# Patient Record
Sex: Female | Born: 1980 | Race: Black or African American | Hispanic: No | Marital: Single | Smoking: Current every day smoker
Health system: Southern US, Community
[De-identification: ages and names within clinical notes are randomized; demographics above are authoritative.]

## PROBLEM LIST (undated history)

## (undated) HISTORY — PX: HERNIA REPAIR: SHX51

---

## 1998-02-27 ENCOUNTER — Ambulatory Visit (HOSPITAL_COMMUNITY): Admission: RE | Admit: 1998-02-27 | Discharge: 1998-02-27 | Payer: Self-pay | Admitting: Obstetrics

## 1998-04-10 ENCOUNTER — Inpatient Hospital Stay (HOSPITAL_COMMUNITY): Admission: AD | Admit: 1998-04-10 | Discharge: 1998-04-10 | Payer: Self-pay | Admitting: *Deleted

## 1998-05-08 ENCOUNTER — Ambulatory Visit (HOSPITAL_COMMUNITY): Admission: RE | Admit: 1998-05-08 | Discharge: 1998-05-08 | Payer: Self-pay | Admitting: Obstetrics

## 1998-05-21 ENCOUNTER — Inpatient Hospital Stay (HOSPITAL_COMMUNITY): Admission: AD | Admit: 1998-05-21 | Discharge: 1998-05-21 | Payer: Self-pay | Admitting: Obstetrics & Gynecology

## 1998-05-25 ENCOUNTER — Inpatient Hospital Stay (HOSPITAL_COMMUNITY): Admission: AD | Admit: 1998-05-25 | Discharge: 1998-05-25 | Payer: Self-pay | Admitting: Obstetrics

## 1998-06-08 ENCOUNTER — Other Ambulatory Visit: Admission: RE | Admit: 1998-06-08 | Discharge: 1998-06-08 | Payer: Self-pay | Admitting: *Deleted

## 1998-06-26 ENCOUNTER — Inpatient Hospital Stay (HOSPITAL_COMMUNITY): Admission: AD | Admit: 1998-06-26 | Discharge: 1998-06-26 | Payer: Self-pay | Admitting: *Deleted

## 1998-09-24 ENCOUNTER — Inpatient Hospital Stay (HOSPITAL_COMMUNITY): Admission: AD | Admit: 1998-09-24 | Discharge: 1998-09-24 | Payer: Self-pay | Admitting: *Deleted

## 1998-09-27 ENCOUNTER — Inpatient Hospital Stay (HOSPITAL_COMMUNITY): Admission: AD | Admit: 1998-09-27 | Discharge: 1998-09-27 | Payer: Self-pay | Admitting: *Deleted

## 1998-10-01 ENCOUNTER — Encounter (HOSPITAL_COMMUNITY): Admission: RE | Admit: 1998-10-01 | Discharge: 1998-10-06 | Payer: Self-pay | Admitting: *Deleted

## 1998-10-05 ENCOUNTER — Inpatient Hospital Stay (HOSPITAL_COMMUNITY): Admission: AD | Admit: 1998-10-05 | Discharge: 1998-10-07 | Payer: Self-pay | Admitting: *Deleted

## 1998-10-14 ENCOUNTER — Emergency Department (HOSPITAL_COMMUNITY): Admission: EM | Admit: 1998-10-14 | Discharge: 1998-10-14 | Payer: Self-pay | Admitting: Emergency Medicine

## 1998-10-17 ENCOUNTER — Inpatient Hospital Stay (HOSPITAL_COMMUNITY): Admission: AD | Admit: 1998-10-17 | Discharge: 1998-10-20 | Payer: Self-pay | Admitting: *Deleted

## 1999-07-30 ENCOUNTER — Inpatient Hospital Stay (HOSPITAL_COMMUNITY): Admission: AD | Admit: 1999-07-30 | Discharge: 1999-07-30 | Payer: Self-pay | Admitting: *Deleted

## 2000-02-02 ENCOUNTER — Emergency Department (HOSPITAL_COMMUNITY): Admission: EM | Admit: 2000-02-02 | Discharge: 2000-02-02 | Payer: Self-pay | Admitting: Emergency Medicine

## 2000-07-12 ENCOUNTER — Emergency Department (HOSPITAL_COMMUNITY): Admission: EM | Admit: 2000-07-12 | Discharge: 2000-07-12 | Payer: Self-pay | Admitting: Emergency Medicine

## 2000-09-30 ENCOUNTER — Emergency Department (HOSPITAL_COMMUNITY): Admission: EM | Admit: 2000-09-30 | Discharge: 2000-10-01 | Payer: Self-pay | Admitting: Emergency Medicine

## 2002-06-10 ENCOUNTER — Inpatient Hospital Stay (HOSPITAL_COMMUNITY): Admission: AD | Admit: 2002-06-10 | Discharge: 2002-06-10 | Payer: Self-pay | Admitting: *Deleted

## 2003-01-20 ENCOUNTER — Emergency Department (HOSPITAL_COMMUNITY): Admission: EM | Admit: 2003-01-20 | Discharge: 2003-01-20 | Payer: Self-pay | Admitting: Emergency Medicine

## 2003-02-06 ENCOUNTER — Emergency Department (HOSPITAL_COMMUNITY): Admission: EM | Admit: 2003-02-06 | Discharge: 2003-02-07 | Payer: Self-pay | Admitting: Emergency Medicine

## 2003-03-16 ENCOUNTER — Inpatient Hospital Stay (HOSPITAL_COMMUNITY): Admission: AD | Admit: 2003-03-16 | Discharge: 2003-03-16 | Payer: Self-pay | Admitting: *Deleted

## 2003-08-12 ENCOUNTER — Emergency Department (HOSPITAL_COMMUNITY): Admission: EM | Admit: 2003-08-12 | Discharge: 2003-08-12 | Payer: Self-pay | Admitting: Emergency Medicine

## 2004-02-23 ENCOUNTER — Emergency Department (HOSPITAL_COMMUNITY): Admission: EM | Admit: 2004-02-23 | Discharge: 2004-02-24 | Payer: Self-pay | Admitting: Emergency Medicine

## 2005-04-04 ENCOUNTER — Emergency Department (HOSPITAL_COMMUNITY): Admission: EM | Admit: 2005-04-04 | Discharge: 2005-04-04 | Payer: Self-pay | Admitting: Family Medicine

## 2005-04-11 ENCOUNTER — Emergency Department (HOSPITAL_COMMUNITY): Admission: EM | Admit: 2005-04-11 | Discharge: 2005-04-11 | Payer: Self-pay | Admitting: Family Medicine

## 2005-08-07 ENCOUNTER — Emergency Department (HOSPITAL_COMMUNITY): Admission: EM | Admit: 2005-08-07 | Discharge: 2005-08-07 | Payer: Self-pay | Admitting: Family Medicine

## 2005-08-25 ENCOUNTER — Inpatient Hospital Stay (HOSPITAL_COMMUNITY): Admission: AD | Admit: 2005-08-25 | Discharge: 2005-08-26 | Payer: Self-pay | Admitting: Obstetrics and Gynecology

## 2005-08-26 ENCOUNTER — Other Ambulatory Visit: Admission: RE | Admit: 2005-08-26 | Discharge: 2005-08-26 | Payer: Self-pay | Admitting: Obstetrics and Gynecology

## 2005-09-22 ENCOUNTER — Emergency Department (HOSPITAL_COMMUNITY): Admission: EM | Admit: 2005-09-22 | Discharge: 2005-09-22 | Payer: Self-pay | Admitting: Emergency Medicine

## 2005-10-30 ENCOUNTER — Inpatient Hospital Stay (HOSPITAL_COMMUNITY): Admission: AD | Admit: 2005-10-30 | Discharge: 2005-10-30 | Payer: Self-pay | Admitting: Obstetrics and Gynecology

## 2005-11-22 ENCOUNTER — Emergency Department (HOSPITAL_COMMUNITY): Admission: EM | Admit: 2005-11-22 | Discharge: 2005-11-22 | Payer: Self-pay | Admitting: Emergency Medicine

## 2005-12-06 ENCOUNTER — Ambulatory Visit (HOSPITAL_COMMUNITY): Admission: RE | Admit: 2005-12-06 | Discharge: 2005-12-06 | Payer: Self-pay | Admitting: Obstetrics and Gynecology

## 2005-12-19 ENCOUNTER — Inpatient Hospital Stay (HOSPITAL_COMMUNITY): Admission: AD | Admit: 2005-12-19 | Discharge: 2005-12-19 | Payer: Self-pay | Admitting: Obstetrics and Gynecology

## 2006-01-16 ENCOUNTER — Inpatient Hospital Stay (HOSPITAL_COMMUNITY): Admission: AD | Admit: 2006-01-16 | Discharge: 2006-01-17 | Payer: Self-pay | Admitting: Internal Medicine

## 2006-03-07 ENCOUNTER — Inpatient Hospital Stay (HOSPITAL_COMMUNITY): Admission: AD | Admit: 2006-03-07 | Discharge: 2006-03-07 | Payer: Self-pay | Admitting: Obstetrics and Gynecology

## 2006-03-30 ENCOUNTER — Inpatient Hospital Stay (HOSPITAL_COMMUNITY): Admission: AD | Admit: 2006-03-30 | Discharge: 2006-03-30 | Payer: Self-pay | Admitting: Obstetrics and Gynecology

## 2006-03-31 ENCOUNTER — Encounter (INDEPENDENT_AMBULATORY_CARE_PROVIDER_SITE_OTHER): Payer: Self-pay | Admitting: Specialist

## 2006-03-31 ENCOUNTER — Inpatient Hospital Stay (HOSPITAL_COMMUNITY): Admission: AD | Admit: 2006-03-31 | Discharge: 2006-04-02 | Payer: Self-pay | Admitting: Obstetrics and Gynecology

## 2006-05-08 ENCOUNTER — Other Ambulatory Visit: Admission: RE | Admit: 2006-05-08 | Discharge: 2006-05-08 | Payer: Self-pay | Admitting: Obstetrics and Gynecology

## 2006-07-10 ENCOUNTER — Emergency Department (HOSPITAL_COMMUNITY): Admission: EM | Admit: 2006-07-10 | Discharge: 2006-07-10 | Payer: Self-pay | Admitting: Family Medicine

## 2006-07-13 ENCOUNTER — Emergency Department (HOSPITAL_COMMUNITY): Admission: EM | Admit: 2006-07-13 | Discharge: 2006-07-13 | Payer: Self-pay | Admitting: Emergency Medicine

## 2006-09-11 ENCOUNTER — Emergency Department (HOSPITAL_COMMUNITY): Admission: EM | Admit: 2006-09-11 | Discharge: 2006-09-11 | Payer: Self-pay | Admitting: Family Medicine

## 2006-10-02 ENCOUNTER — Emergency Department (HOSPITAL_COMMUNITY): Admission: EM | Admit: 2006-10-02 | Discharge: 2006-10-02 | Payer: Self-pay | Admitting: Family Medicine

## 2007-01-12 ENCOUNTER — Emergency Department (HOSPITAL_COMMUNITY): Admission: EM | Admit: 2007-01-12 | Discharge: 2007-01-12 | Payer: Self-pay | Admitting: Emergency Medicine

## 2007-03-18 ENCOUNTER — Emergency Department (HOSPITAL_COMMUNITY): Admission: EM | Admit: 2007-03-18 | Discharge: 2007-03-18 | Payer: Self-pay | Admitting: Emergency Medicine

## 2007-05-07 ENCOUNTER — Emergency Department (HOSPITAL_COMMUNITY): Admission: EM | Admit: 2007-05-07 | Discharge: 2007-05-07 | Payer: Self-pay | Admitting: Family Medicine

## 2007-06-14 ENCOUNTER — Emergency Department (HOSPITAL_COMMUNITY): Admission: EM | Admit: 2007-06-14 | Discharge: 2007-06-14 | Payer: Self-pay | Admitting: Emergency Medicine

## 2007-07-27 ENCOUNTER — Emergency Department (HOSPITAL_COMMUNITY): Admission: EM | Admit: 2007-07-27 | Discharge: 2007-07-27 | Payer: Self-pay | Admitting: Family Medicine

## 2007-08-22 ENCOUNTER — Emergency Department (HOSPITAL_COMMUNITY): Admission: EM | Admit: 2007-08-22 | Discharge: 2007-08-22 | Payer: Self-pay | Admitting: Emergency Medicine

## 2007-10-09 ENCOUNTER — Emergency Department (HOSPITAL_COMMUNITY): Admission: EM | Admit: 2007-10-09 | Discharge: 2007-10-09 | Payer: Self-pay | Admitting: Emergency Medicine

## 2007-10-13 ENCOUNTER — Emergency Department (HOSPITAL_COMMUNITY): Admission: EM | Admit: 2007-10-13 | Discharge: 2007-10-13 | Payer: Self-pay | Admitting: Family Medicine

## 2007-11-12 ENCOUNTER — Emergency Department (HOSPITAL_COMMUNITY): Admission: EM | Admit: 2007-11-12 | Discharge: 2007-11-12 | Payer: Self-pay | Admitting: Family Medicine

## 2007-12-29 ENCOUNTER — Emergency Department (HOSPITAL_COMMUNITY): Admission: EM | Admit: 2007-12-29 | Discharge: 2007-12-29 | Payer: Self-pay | Admitting: Emergency Medicine

## 2008-01-29 ENCOUNTER — Inpatient Hospital Stay (HOSPITAL_COMMUNITY): Admission: AD | Admit: 2008-01-29 | Discharge: 2008-01-29 | Payer: Self-pay | Admitting: Gynecology

## 2009-06-30 ENCOUNTER — Emergency Department (HOSPITAL_COMMUNITY): Admission: EM | Admit: 2009-06-30 | Discharge: 2009-06-30 | Payer: Self-pay | Admitting: Family Medicine

## 2009-11-02 ENCOUNTER — Emergency Department (HOSPITAL_COMMUNITY): Admission: EM | Admit: 2009-11-02 | Discharge: 2009-11-02 | Payer: Self-pay | Admitting: Emergency Medicine

## 2010-03-11 ENCOUNTER — Emergency Department (HOSPITAL_COMMUNITY): Admission: EM | Admit: 2010-03-11 | Discharge: 2010-03-11 | Payer: Self-pay | Admitting: Emergency Medicine

## 2010-04-19 ENCOUNTER — Emergency Department (HOSPITAL_COMMUNITY): Admission: EM | Admit: 2010-04-19 | Discharge: 2010-04-19 | Payer: Self-pay | Admitting: Emergency Medicine

## 2010-08-12 ENCOUNTER — Emergency Department (HOSPITAL_COMMUNITY): Admission: EM | Admit: 2010-08-12 | Discharge: 2010-08-12 | Payer: Self-pay | Admitting: Emergency Medicine

## 2011-02-02 ENCOUNTER — Inpatient Hospital Stay (INDEPENDENT_AMBULATORY_CARE_PROVIDER_SITE_OTHER)
Admission: RE | Admit: 2011-02-02 | Discharge: 2011-02-02 | Disposition: A | Discharge status: Home / Self Care | Payer: Self-pay | Source: Ambulatory Visit | Attending: Family Medicine | Admitting: Family Medicine

## 2011-02-02 DIAGNOSIS — N390 Urinary tract infection, site not specified: Secondary | ICD-10-CM

## 2011-02-02 DIAGNOSIS — IMO0002 Reserved for concepts with insufficient information to code with codable children: Secondary | ICD-10-CM

## 2011-02-02 DIAGNOSIS — N760 Acute vaginitis: Secondary | ICD-10-CM

## 2011-02-02 LAB — POCT URINALYSIS DIPSTICK
Protein, ur: NEGATIVE mg/dL
Urobilinogen, UA: 0.2 mg/dL (ref 0.0–1.0)

## 2011-02-02 LAB — WET PREP, GENITAL

## 2011-02-02 LAB — POCT PREGNANCY, URINE: Preg Test, Ur: NEGATIVE

## 2011-02-04 LAB — URINE CULTURE
Colony Count: 45000
Culture  Setup Time: 201202160130

## 2011-03-11 LAB — POCT URINALYSIS DIP (DEVICE)
Nitrite: NEGATIVE
Protein, ur: NEGATIVE mg/dL
Urobilinogen, UA: 0.2 mg/dL (ref 0.0–1.0)
pH: 5 (ref 5.0–8.0)

## 2011-03-11 LAB — WET PREP, GENITAL

## 2011-06-20 ENCOUNTER — Emergency Department (HOSPITAL_COMMUNITY)
Admission: EM | Admit: 2011-06-20 | Discharge: 2011-06-20 | Disposition: A | Discharge status: Home / Self Care | Payer: Self-pay | Attending: Emergency Medicine | Admitting: Emergency Medicine

## 2011-06-20 DIAGNOSIS — N760 Acute vaginitis: Secondary | ICD-10-CM | POA: Insufficient documentation

## 2011-06-20 LAB — URINALYSIS, ROUTINE W REFLEX MICROSCOPIC
Ketones, ur: NEGATIVE mg/dL
Protein, ur: NEGATIVE mg/dL
Specific Gravity, Urine: 1.012 (ref 1.005–1.030)

## 2011-06-20 LAB — URINE MICROSCOPIC-ADD ON

## 2011-06-20 LAB — WET PREP, GENITAL: Trich, Wet Prep: NONE SEEN

## 2011-06-20 LAB — PREGNANCY, URINE: Preg Test, Ur: NEGATIVE

## 2011-06-21 LAB — GC/CHLAMYDIA PROBE AMP, GENITAL: Chlamydia, DNA Probe: NEGATIVE

## 2011-09-09 LAB — URINALYSIS, ROUTINE W REFLEX MICROSCOPIC
Bilirubin Urine: NEGATIVE
Glucose, UA: NEGATIVE
Nitrite: NEGATIVE
Specific Gravity, Urine: 1.015
pH: 6

## 2011-09-09 LAB — POCT PREGNANCY, URINE: Operator id: 242691

## 2011-09-09 LAB — WET PREP, GENITAL
Trich, Wet Prep: NONE SEEN
Yeast Wet Prep HPF POC: NONE SEEN

## 2011-09-27 LAB — POCT URINALYSIS DIP (DEVICE)
Bilirubin Urine: NEGATIVE
Glucose, UA: NEGATIVE
Ketones, ur: NEGATIVE
pH: 5.5

## 2011-09-27 LAB — GC/CHLAMYDIA PROBE AMP, GENITAL: Chlamydia, DNA Probe: NEGATIVE

## 2011-09-27 LAB — WET PREP, GENITAL
Trich, Wet Prep: NONE SEEN
Yeast Wet Prep HPF POC: NONE SEEN

## 2011-09-28 LAB — POCT RAPID STREP A: Streptococcus, Group A Screen (Direct): NEGATIVE

## 2011-10-03 LAB — POCT URINALYSIS DIP (DEVICE)
Glucose, UA: NEGATIVE
Hgb urine dipstick: NEGATIVE
Ketones, ur: NEGATIVE
Specific Gravity, Urine: 1.01
Urobilinogen, UA: 0.2

## 2011-10-03 LAB — GC/CHLAMYDIA PROBE AMP, GENITAL
Chlamydia, DNA Probe: NEGATIVE
GC Probe Amp, Genital: NEGATIVE

## 2011-10-03 LAB — WET PREP, GENITAL

## 2011-10-05 LAB — URINALYSIS, ROUTINE W REFLEX MICROSCOPIC
Bilirubin Urine: NEGATIVE
Glucose, UA: NEGATIVE
Ketones, ur: NEGATIVE
Protein, ur: NEGATIVE
Urobilinogen, UA: 0.2

## 2012-01-08 ENCOUNTER — Emergency Department (INDEPENDENT_AMBULATORY_CARE_PROVIDER_SITE_OTHER)
Admission: EM | Admit: 2012-01-08 | Discharge: 2012-01-08 | Disposition: A | Discharge status: Home / Self Care | Payer: Self-pay | Source: Home / Self Care

## 2012-01-08 ENCOUNTER — Encounter (HOSPITAL_COMMUNITY): Payer: Self-pay | Admitting: *Deleted

## 2012-01-08 DIAGNOSIS — L0390 Cellulitis, unspecified: Secondary | ICD-10-CM

## 2012-01-08 DIAGNOSIS — L0291 Cutaneous abscess, unspecified: Secondary | ICD-10-CM

## 2012-01-08 LAB — CULTURE, ROUTINE-ABSCESS

## 2012-01-08 MED ORDER — SULFAMETHOXAZOLE-TRIMETHOPRIM 800-160 MG PO TABS: 1.0000 | ORAL_TABLET | Freq: Two times a day (BID) | ORAL | Status: AC

## 2012-01-08 MED ORDER — HYDROCODONE-ACETAMINOPHEN 5-325 MG PO TABS: 2.0000 | ORAL_TABLET | ORAL | Status: AC | PRN

## 2012-01-08 NOTE — ED Provider Notes (Signed)
History     CSN: [id number]  Arrival date & time 01/08/12  1829   None     Chief Complaint  Patient presents with  . Abscess    (Consider location/radiation/quality/duration/timing/severity/associated sxs/prior treatment) HPI Comments: Pt used warm compresses on abd for last 2 days which did not relieve pain/abscess  Patient is a 31 y.o. female presenting with abscess. The history is provided by the patient.  Abscess  This is a new problem. Episode onset: 2-3 days ago. The onset was gradual. The problem has been gradually worsening. The abscess is present on the abdomen. The problem is moderate. The abscess is characterized by painfulness. Pertinent negatives include no fever.    History reviewed. No pertinent past medical history.  History reviewed. No pertinent past surgical history.  No family history on file.  History  Substance Use Topics  . Smoking status: Current Some Day Smoker  . Smokeless tobacco: Not on file  . Alcohol Use: No    OB History    Grav Para Term Preterm Abortions TAB SAB Ect Mult Living                  Review of Systems  Constitutional: Negative for fever and chills.  Skin: Positive for color change.       Abscess on abdomen    Allergies  Review of patient's allergies indicates no known allergies.  Home Medications   Current Outpatient Rx  Name Route Sig Dispense Refill  . HYDROCODONE-ACETAMINOPHEN 5-325 MG PO TABS Oral Take 2 tablets by mouth every 4 (four) hours as needed for pain. 20 tablet 0  . SULFAMETHOXAZOLE-TRIMETHOPRIM 800-160 MG PO TABS Oral Take 1 tablet by mouth every 12 (twelve) hours. 20 tablet 0    BP 136/94  Pulse 92  Temp(Src) 98.3 F (36.8 C) (Oral)  Resp 18  SpO2 98%  Physical Exam  Constitutional: She appears well-developed and well-nourished. No distress.       obese  Pulmonary/Chest: Effort normal.  Skin: Skin is warm and dry. There is erythema.       ED Course  Procedures (including critical  care time)   Labs Reviewed  CULTURE, ROUTINE-ABSCESS   No results found.   1. Abscess       MDM          Cathlyn Parsons, NP 01/08/12 1956

## 2012-01-08 NOTE — ED Notes (Signed)
C/O tenderness and pain to possible abscess right lower abdomen.  Has been applying warm compresses.

## 2012-01-08 NOTE — ED Notes (Signed)
I&D right lower abdominal abscess per A. Kabbe, PA.  1/4" packing placed.  4x4 dressing applied.  Wound care instructions reviewed w/ pt.  Pt verbalized understanding

## 2012-01-10 ENCOUNTER — Encounter (HOSPITAL_COMMUNITY): Payer: Self-pay | Admitting: Emergency Medicine

## 2012-01-10 ENCOUNTER — Emergency Department (INDEPENDENT_AMBULATORY_CARE_PROVIDER_SITE_OTHER)
Admission: EM | Admit: 2012-01-10 | Discharge: 2012-01-10 | Disposition: A | Discharge status: Home / Self Care | Payer: Medicaid Other | Source: Home / Self Care | Attending: Emergency Medicine | Admitting: Emergency Medicine

## 2012-01-10 DIAGNOSIS — Z4801 Encounter for change or removal of surgical wound dressing: Secondary | ICD-10-CM

## 2012-01-10 DIAGNOSIS — L0291 Cutaneous abscess, unspecified: Secondary | ICD-10-CM

## 2012-01-10 NOTE — ED Provider Notes (Signed)
Medical screening examination/treatment/procedure(s) were performed by non-physician practitioner and as supervising physician I was immediately available for consultation/collaboration.  Raynald Blend, MD 01/10/12 [phone]

## 2012-01-10 NOTE — ED Provider Notes (Signed)
Medical screening examination/treatment/procedure(s) were performed by non-physician practitioner and as supervising physician I was immediately available for consultation/collaboration.   Barkley Bruns MD.    Barkley Bruns, MD 01/10/12 [phone]

## 2012-01-10 NOTE — ED Provider Notes (Signed)
Beverly [name] is a 31 y.o. female who presents to Urgent Care today for abscess check.  In the interim she's changed her abdominal dressing once a continues to have yellowish discharge. Her pain has lessened dramatically and she has not had any fevers or chills. She feels well   PMH reviewed.  ROS as above otherwise neg Medications reviewed. No current facility-administered medications for this encounter.   Current Outpatient Prescriptions  Medication Sig Dispense Refill  . HYDROcodone-acetaminophen (NORCO) 5-325 MG per tablet Take 2 tablets by mouth every 4 (four) hours as needed for pain.  20 tablet  0  . sulfamethoxazole-trimethoprim (SEPTRA DS) 800-160 MG per tablet Take 1 tablet by mouth every 12 (twelve) hours.  20 tablet  0    Exam:  BP 116/68  Pulse 63  Temp(Src) 98.4 F (36.9 C) (Oral)  Resp 22  SpO2 100% Gen: Well NAD `Skin: Abscess with packing emerging yellow discharge no significant surrounding erythema or tenderness.  3 inches of packing removed. Dressing applied  Assessment and Plan: 31 year old woman with wound check.  Doing well. Remove 3 inches of packing material. Instructed patient to remove one to 2 inches of packing every other day until packing all point on. Handout on this provided. Also advised her to continue her 10 days of Septra antibiotics. He expresses understanding and will look out for red flag signs or symptoms such as fevers chills or worsening erythema, and pain.      Clementeen Graham, MD 01/10/12 1843

## 2012-01-10 NOTE — ED Notes (Signed)
Reports recheck of abscess wound.  Reports continued pain.  Pain is slightly better.

## 2012-02-03 ENCOUNTER — Emergency Department (HOSPITAL_COMMUNITY)
Admission: EM | Admit: 2012-02-03 | Discharge: 2012-02-03 | Disposition: A | Discharge status: Home / Self Care | Payer: Medicaid Other | Attending: Emergency Medicine | Admitting: Emergency Medicine

## 2012-02-03 ENCOUNTER — Encounter (HOSPITAL_COMMUNITY): Payer: Self-pay | Admitting: Emergency Medicine

## 2012-02-03 DIAGNOSIS — R112 Nausea with vomiting, unspecified: Secondary | ICD-10-CM | POA: Insufficient documentation

## 2012-02-03 DIAGNOSIS — R5381 Other malaise: Secondary | ICD-10-CM | POA: Insufficient documentation

## 2012-02-03 DIAGNOSIS — R51 Headache: Secondary | ICD-10-CM

## 2012-02-03 DIAGNOSIS — R5383 Other fatigue: Secondary | ICD-10-CM | POA: Insufficient documentation

## 2012-02-03 MED ORDER — HYDROMORPHONE HCL PF 1 MG/ML IJ SOLN
1.0000 mg | Freq: Once | INTRAMUSCULAR | Status: AC
  Administered 2012-02-03: 1 mg via INTRAVENOUS
  Filled 2012-02-03: qty 1

## 2012-02-03 MED ORDER — OXYCODONE-ACETAMINOPHEN 5-325 MG PO TABS: 1.0000 | ORAL_TABLET | Freq: Four times a day (QID) | ORAL | Status: AC | PRN

## 2012-02-03 MED ORDER — PROMETHAZINE HCL 25 MG/ML IJ SOLN
12.5000 mg | Freq: Once | INTRAMUSCULAR | Status: AC
  Administered 2012-02-03: 12.5 mg via INTRAVENOUS
  Filled 2012-02-03: qty 1

## 2012-02-03 MED ORDER — ONDANSETRON 8 MG PO TBDP
8.0000 mg | ORAL_TABLET | Freq: Once | ORAL | Status: AC
  Administered 2012-02-03: 8 mg via ORAL

## 2012-02-03 MED ORDER — ONDANSETRON 8 MG PO TBDP
ORAL_TABLET | ORAL | Status: AC
  Filled 2012-02-03: qty 1

## 2012-02-03 MED ORDER — SUMATRIPTAN SUCCINATE 6 MG/0.5ML ~~LOC~~ SOLN
6.0000 mg | Freq: Once | SUBCUTANEOUS | Status: AC
  Administered 2012-02-03: 6 mg via SUBCUTANEOUS
  Filled 2012-02-03: qty 0.5

## 2012-02-03 NOTE — ED Provider Notes (Signed)
History     CSN: [id number]  Arrival date & time 02/03/12  0051   First MD Initiated Contact with Patient 02/03/12 0232      Chief Complaint  Patient presents with  . Headache    (Consider location/radiation/quality/duration/timing/severity/associated sxs/prior treatment) Patient is a 31 y.o. female presenting with headaches. The history is provided by the patient.  Headache  This is a recurrent problem. The current episode started 3 to 5 hours ago. The problem occurs constantly. The problem has been gradually worsening. The headache is associated with nothing. The pain is located in the frontal and temporal region. The pain is severe. The pain does not radiate. Associated symptoms include malaise/fatigue, nausea and vomiting. Pertinent negatives include no anorexia and no fever. She has tried nothing for the symptoms.    Past Medical History  Diagnosis Date  . Migraine     History reviewed. No pertinent past surgical history.  History reviewed. No pertinent family history.  History  Substance Use Topics  . Smoking status: Former Games developer  . Smokeless tobacco: Not on file  . Alcohol Use: No    OB History    Grav Para Term Preterm Abortions TAB SAB Ect Mult Living                  Review of Systems  Constitutional: Positive for malaise/fatigue. Negative for fever.  Gastrointestinal: Positive for nausea and vomiting. Negative for anorexia.  Neurological: Positive for headaches.  All other systems reviewed and are negative.    Allergies  Review of patient's allergies indicates no known allergies.  Home Medications  No current outpatient prescriptions on file.  BP 121/68  Pulse 58  Temp(Src) 98 F (36.7 C) (Oral)  Resp 17  Wt 263 lb (119.296 kg)  SpO2 100%  Physical Exam  Nursing note and vitals reviewed. Constitutional: She is oriented to person, place, and time. She appears well-developed and well-nourished. No distress.  HENT:  Head: Normocephalic and  atraumatic.  Eyes: EOM are normal. Pupils are equal, round, and reactive to light.       Np papilledema  Neck: Normal range of motion. Neck supple.  Cardiovascular: Normal rate and regular rhythm.  Exam reveals no gallop and no friction rub.   No murmur heard. Pulmonary/Chest: Effort normal and breath sounds normal. No respiratory distress. She has no wheezes.  Abdominal: Soft. Bowel sounds are normal. She exhibits no distension. There is no tenderness.  Musculoskeletal: Normal range of motion.  Neurological: She is alert and oriented to person, place, and time. No cranial nerve deficit. She exhibits normal muscle tone. Coordination normal.  Skin: Skin is warm and dry. She is not diaphoretic.    ED Course  Procedures (including critical care time)  Labs Reviewed - No data to display No results found.   No diagnosis found.    MDM  Feels better with meds.  Will discharge to home.        Geoffery Lyons, MD 02/03/12 [phone]

## 2012-02-03 NOTE — ED Notes (Signed)
Pt alert, nad, c/o headache, onset this two days ago, hx of same, no relief at home, speech clear, ambulates to triage, steady gait noted

## 2012-02-03 NOTE — ED Notes (Signed)
Pt ambulated to the bathroom alone without difficulty with a steady gait

## 2012-06-14 ENCOUNTER — Encounter (HOSPITAL_COMMUNITY): Payer: Self-pay

## 2012-06-14 ENCOUNTER — Emergency Department (HOSPITAL_COMMUNITY)
Admission: EM | Admit: 2012-06-14 | Discharge: 2012-06-14 | Disposition: A | Discharge status: Home / Self Care | Payer: Self-pay | Attending: Emergency Medicine | Admitting: Emergency Medicine

## 2012-06-14 DIAGNOSIS — L0291 Cutaneous abscess, unspecified: Secondary | ICD-10-CM

## 2012-06-14 DIAGNOSIS — IMO0002 Reserved for concepts with insufficient information to code with codable children: Secondary | ICD-10-CM | POA: Insufficient documentation

## 2012-06-14 DIAGNOSIS — Z87891 Personal history of nicotine dependence: Secondary | ICD-10-CM | POA: Insufficient documentation

## 2012-06-14 MED ORDER — OXYCODONE-ACETAMINOPHEN 5-325 MG PO TABS
1.0000 | ORAL_TABLET | Freq: Once | ORAL | Status: AC
  Administered 2012-06-14: 1 via ORAL
  Filled 2012-06-14: qty 1

## 2012-06-14 MED ORDER — OXYCODONE-ACETAMINOPHEN 5-325 MG PO TABS: 1.0000 | ORAL_TABLET | Freq: Four times a day (QID) | ORAL | Status: AC | PRN

## 2012-06-14 NOTE — ED Provider Notes (Signed)
Medical screening examination/treatment/procedure(s) were performed by non-physician practitioner and as supervising physician I was immediately available for consultation/collaboration.    Levetta Bognar L Juleon Narang, MD 06/14/12 1702 

## 2012-06-14 NOTE — Discharge Instructions (Signed)
Abscess An abscess (boil or furuncle) is an infected area that contains a collection of pus.  SYMPTOMS Signs and symptoms of an abscess include pain, tenderness, redness, or hardness. You may feel a moveable soft area under your skin. An abscess can occur anywhere in the body.  TREATMENT  A surgical cut (incision) may be made over your abscess to drain the pus. Gauze may be packed into the space or a drain may be looped through the abscess cavity (pocket). This provides a drain that will allow the cavity to heal from the inside outwards. The abscess may be painful for a few days, but should feel much better if it was drained.  Your abscess, if seen early, may not have localized and may not have been drained. If not, another appointment may be required if it does not get better on its own or with medications. HOME CARE INSTRUCTIONS   Only take over-the-counter or prescription medicines for pain, discomfort, or fever as directed by your caregiver.   Take your antibiotics as directed if they were prescribed. Finish them even if you start to feel better.   Keep the skin and clothes clean around your abscess.   If the abscess was drained, you will need to use gauze dressing to collect any draining pus. Dressings will typically need to be changed 3 or more times a day.   The infection may spread by skin contact with others. Avoid skin contact as much as possible.   Practice good hygiene. This includes regular hand washing, cover any draining skin lesions, and do not share personal care items.   If you participate in sports, do not share athletic equipment, towels, whirlpools, or personal care items. Shower after every practice or tournament.   If a draining area cannot be adequately covered:   Do not participate in sports.   Children should not participate in day care until the wound has healed or drainage stops.   If your caregiver has given you a follow-up appointment, it is very important  to keep that appointment. Not keeping the appointment could result in a much worse infection, chronic or permanent injury, pain, and disability. If there is any problem keeping the appointment, you must call back to this facility for assistance.  SEEK MEDICAL CARE IF:   You develop increased pain, swelling, redness, drainage, or bleeding in the wound site.   You develop signs of generalized infection including muscle aches, chills, fever, or a general ill feeling.   You have an oral temperature above 102 F (38.9 C).  MAKE SURE YOU:   Understand these instructions.   Will watch your condition.   Will get help right away if you are not doing well or get worse.  Document Released: 09/14/2005 Document Revised: 11/24/2011 Document Reviewed: 07/08/2008 [facility] Patient Information 2012 [place], [place].Abscess Care After An abscess (also called a boil or furuncle) is an infected area that contains a collection of pus. Signs and symptoms of an abscess include pain, tenderness, redness, or hardness, or you may feel a moveable soft area under your skin. An abscess can occur anywhere in the body. The infection may spread to surrounding tissues causing cellulitis. A cut (incision) by the surgeon was made over your abscess and the pus was drained out. Gauze may have been packed into the space to provide a drain that will allow the cavity to heal from the inside outwards. The boil may be painful for 5 to 7 days. Most people with a boil  do not have high fevers. Your abscess, if seen early, may not have localized, and may not have been lanced. If not, another appointment may be required for this if it does not get better on its own or with medications. HOME CARE INSTRUCTIONS   Only take over-the-counter or prescription medicines for pain, discomfort, or fever as directed by your caregiver.   When you bathe, soak and then remove gauze or iodoform packs at least daily or as directed by your caregiver. You may  then wash the wound gently with mild soapy water. Repack with gauze or do as your caregiver directs.  SEEK IMMEDIATE MEDICAL CARE IF:   You develop increased pain, swelling, redness, drainage, or bleeding in the wound site.   You develop signs of generalized infection including muscle aches, chills, fever, or a general ill feeling.   An oral temperature above 102 F (38.9 C) develops, not controlled by medication.  See your caregiver for a recheck if you develop any of the symptoms described above. If medications (antibiotics) were prescribed, take them as directed. Document Released: 06/23/2005 Document Revised: 11/24/2011 Document Reviewed: 02/18/2008 [facility] Patient Information 2012 [place], [place].Community-Associated MRSA CA-MRSA stands for community-associated methicillin-resistant Staphylococcus aureus. MRSA is a type of bacteria that is resistant to some common antibiotics. It can cause infections in the skin and many other places in the body. Staphylococcus aureus, often called "staph," is a bacteria that normally lives on the skin or in the nose. Staph on the surface of the skin or in the nose does not cause problems. However, if the staph enters the body through a cut, wound, or break in the skin, an infection can happen. Up until recently, infections with the MRSA type of staph mainly occurred in hospitals and other healthcare settings. There are now increasing problems with MRSA infections in the community as well. Infections with MRSA may be very serious or even life-threatening. CA-MRSA is becoming more common. It is known to spread in crowded settings, in jails and prisons, and in situations where there is close skin-to-skin contact, such as during sporting events or in locker rooms. MRSA can be spread through shared items, such as children's toys, razors, towels, or sports equipment.  CAUSES All staph, including MRSA, are normally harmless unless they enter the body through a  scratch, cut, or wound, such as with surgery. All staph, including MRSA, can be spread from person-to-person by touching contaminated objects or through direct contact.  MRSA now causes illness in people who have not been in hospitals or other healthcare facilities. Cases of MRSA diseases in the community have been associated with:   Recent antibiotic use.   Sharing contaminated towels or clothes.   Having active skin diseases.   Participating in contact sports.   Living in crowded settings.   Intravenous (IV) drug use.   Community-associated MRSA infections are usually skin infections, but may cause other severe illnesses.   Staph bacteria are one of the most common causes of skin infection. However, they are also a common cause of pneumonia, bone or joint infections, and bloodstream infections.  DIAGNOSIS Diagnosis of MRSA is done by cultures of fluid samples that may come from:  Swabs taken from cuts or wounds in infected areas.   Nasal swabs.   Saliva or deep cough specimens from the lungs (sputum).   Urine.   Blood.  Many people are "colonized" with MRSA but have no signs of infection. This means that people carry the MRSA germ on their  skin or in their nose and may never develop MRSA infection.  TREATMENT  Treatment varies and is based on how serious, how deep, or how extensive the infection is. For example:  Some skin infections, such as a small boil or abscess, may be treated by draining yellowish-white fluid (pus) from the site of the infection.   Deeper or more widespread soft tissue infections are usually treated with surgery to drain pus and with antibiotic medicine given by vein or by mouth. This may be recommended even if you are pregnant.   Serious infections may require a hospital stay.  If antibiotics are given, they may be needed for several weeks. PREVENTION Because many people are colonized with staph, including MRSA, preventing the spread of the bacteria  from person-to-person is most important. The best way to prevent the spread of bacteria and other germs is through proper hand washing or by using alcohol-based hand disinfectants. The following are other ways to help prevent MRSA infection within community settings.   Wash your hands frequently with soap and water for at least 15 seconds. Otherwise, use alcohol-based hand disinfectants when soap and water is not available.   Make sure people who live with you wash their hands often, too.   Do not share personal items. For example, avoid sharing razors and other personal hygiene items, towels, clothing, and athletic equipment.   Wash and dry your clothes and bedding at the warmest temperatures recommended on the labels.   Keep wounds covered. Pus from infected sores may contain MRSA and other bacteria. Keep cuts and abrasions clean and covered with germ-free (sterile), dry bandages until they are healed.   If you have a wound that appears infected, ask your caregiver if a culture for MRSA and other bacteria should be done.   If you are breastfeeding, talk to your caregiver about MRSA. You may be asked to temporarily stop breastfeeding.  HOME CARE INSTRUCTIONS   Take your antibiotics as directed. Finish them even if you start to feel better.   Avoid close contact with those around you as much as possible. Do not use towels, razors, toothbrushes, bedding, or other items that will be used by others.   To fight the infection, follow your caregiver's instructions for wound care. Wash your hands before and after changing your bandages.   If you have an intravascular device, such as a catheter, make sure you know how to care for it.   Be sure to tell any healthcare providers that you have MRSA so they are aware of your infection.  SEEK IMMEDIATE MEDICAL CARE IF:  The infection appears to be getting worse. Signs include:   Increased warmth, redness, or tenderness around the wound site.   A  red line that extends from the infection site.   A dark color in the area around the infection.   Wound drainage that is tan, yellow, or green.   A bad smell coming from the wound.   You feel sick to your stomach (nauseous) and throw up (vomit) or cannot keep medicine down.   You have a fever.   Your baby is older than 3 months with a rectal temperature of 102 F (38.9 C) or higher.   Your baby is 75 months old or younger with a rectal temperature of 100.4 F (38 C) or higher.   You have difficulty breathing.  MAKE SURE YOU:   Understand these instructions.   Will watch your condition.   Will get help right  away if you are not doing well or get worse.  Document Released: 03/10/2006 Document Revised: 11/24/2011 Document Reviewed: 03/10/2011 [facility] Patient Information 2012 [place], [place].

## 2012-06-14 NOTE — ED Provider Notes (Signed)
History     CSN: [id number]  Arrival date & time 06/14/12  1408   First MD Initiated Contact with Patient 06/14/12 1632      Chief Complaint  Patient presents with  . Abscess    under rt arm    (Consider location/radiation/quality/duration/timing/severity/associated sxs/prior treatment) HPI  Pt presents to the ED with complaints of abscess to right axilla. The patient has had abscesses before but she admits it has been a year since her last abscess. She admits she stopped shaving and stopped smoking to help get rid of them. She denies systemic symptoms of fevers, chills, nausea, vomiting, diarrhea.  Past Medical History  Diagnosis Date  . Migraine     No past surgical history on file.  No family history on file.  History  Substance Use Topics  . Smoking status: Former Games developer  . Smokeless tobacco: Not on file  . Alcohol Use: No    OB History    Grav Para Term Preterm Abortions TAB SAB Ect Mult Living                  Review of Systems   HEENT: denies blurry vision or change in hearing PULMONARY: Denies difficulty breathing and SOB CARDIAC: denies chest pain or heart palpitations MUSCULOSKELETAL:  denies being unable to ambulate ABDOMEN AL: denies abdominal pain GU: denies loss of bowel or urinary control NEURO: denies numbness and tingling in extremities SKIN: new abscess PSYCH: patient behavior is normal NECK: Not complaining of neck pain     Allergies  Review of patient's allergies indicates no known allergies.  Home Medications   Current Outpatient Rx  Name Route Sig Dispense Refill  . OXYCODONE-ACETAMINOPHEN 5-325 MG PO TABS Oral Take 1 tablet by mouth every 6 (six) hours as needed for pain. 10 tablet 0    BP 125/75  Pulse 74  Temp 98.2 F (36.8 C) (Oral)  Resp 18  SpO2 100%  Physical Exam  Nursing note and vitals reviewed. Constitutional: She appears well-developed and well-nourished. No distress.  HENT:  Head: Normocephalic and  atraumatic.  Eyes: Pupils are equal, round, and reactive to light.  Neck: Normal range of motion. Neck supple.  Cardiovascular: Normal rate and regular rhythm.   Pulmonary/Chest: Effort normal.  Abdominal: Soft.  Neurological: She is alert.  Skin: Skin is warm and dry.       ED Course  Procedures (including critical care time)  Labs Reviewed - No data to display No results found.   1. Abscess       MDM  INCISION AND DRAINAGE Performed by: Dorthula Matas Consent: Verbal consent obtained. Risks and benefits: risks, benefits and alternatives were discussed Type: abscess  Body area: right axilla  Anesthesia: local infiltration  Local anesthetic: lidocaine 2% with epinephrine  Anesthetic total: 4 ml  Complexity: complex Blunt dissection to break up loculations  Drainage: purulent  Drainage amount: large  Packing material: 1/4 in iodoform gauze  Patient tolerance: Patient tolerated the procedure well with no immediate complications.   Pt given rx for percocet. Return in 2 days to have packing removed. No abx necessary at this time.    Pt has been advised of the symptoms that warrant their return to the ED. Patient has voiced understanding and has agreed to follow-up with the PCP or specialist.        Dorthula Matas, PA 06/14/12 1701

## 2012-06-14 NOTE — ED Notes (Signed)
Pt c/o abscess under rt arm x1wk, c/o pain

## 2012-12-23 ENCOUNTER — Emergency Department (HOSPITAL_COMMUNITY)
Admission: EM | Admit: 2012-12-23 | Discharge: 2012-12-23 | Disposition: A | Discharge status: Home Health | Payer: Self-pay | Attending: Emergency Medicine | Admitting: Emergency Medicine

## 2012-12-23 ENCOUNTER — Encounter (HOSPITAL_COMMUNITY): Payer: Self-pay | Admitting: Emergency Medicine

## 2012-12-23 DIAGNOSIS — H53149 Visual discomfort, unspecified: Secondary | ICD-10-CM | POA: Insufficient documentation

## 2012-12-23 DIAGNOSIS — E669 Obesity, unspecified: Secondary | ICD-10-CM | POA: Insufficient documentation

## 2012-12-23 DIAGNOSIS — H538 Other visual disturbances: Secondary | ICD-10-CM | POA: Insufficient documentation

## 2012-12-23 DIAGNOSIS — G43909 Migraine, unspecified, not intractable, without status migrainosus: Secondary | ICD-10-CM | POA: Insufficient documentation

## 2012-12-23 DIAGNOSIS — Z87891 Personal history of nicotine dependence: Secondary | ICD-10-CM | POA: Insufficient documentation

## 2012-12-23 DIAGNOSIS — R112 Nausea with vomiting, unspecified: Secondary | ICD-10-CM | POA: Insufficient documentation

## 2012-12-23 MED ORDER — DEXAMETHASONE SODIUM PHOSPHATE 10 MG/ML IJ SOLN
10.0000 mg | Freq: Once | INTRAMUSCULAR | Status: AC
  Administered 2012-12-23: 10 mg via INTRAVENOUS
  Filled 2012-12-23: qty 1

## 2012-12-23 MED ORDER — METOCLOPRAMIDE HCL 5 MG/ML IJ SOLN
10.0000 mg | Freq: Once | INTRAMUSCULAR | Status: AC
  Administered 2012-12-23: 10 mg via INTRAVENOUS
  Filled 2012-12-23: qty 2

## 2012-12-23 MED ORDER — SODIUM CHLORIDE 0.9 % IV BOLUS (SEPSIS)
1000.0000 mL | Freq: Once | INTRAVENOUS | Status: AC
  Administered 2012-12-23: 1000 mL via INTRAVENOUS

## 2012-12-23 MED ORDER — KETOROLAC TROMETHAMINE 30 MG/ML IJ SOLN
30.0000 mg | Freq: Once | INTRAMUSCULAR | Status: AC
  Administered 2012-12-23: 30 mg via INTRAVENOUS
  Filled 2012-12-23: qty 1

## 2012-12-23 MED ORDER — SUMATRIPTAN SUCCINATE 100 MG PO TABS: 100.0000 mg | ORAL_TABLET | ORAL | Status: DC | PRN

## 2012-12-23 MED ORDER — DIPHENHYDRAMINE HCL 50 MG/ML IJ SOLN
25.0000 mg | Freq: Once | INTRAMUSCULAR | Status: AC
  Administered 2012-12-23: 25 mg via INTRAVENOUS
  Filled 2012-12-23: qty 1

## 2012-12-23 NOTE — ED Provider Notes (Signed)
Medical screening examination/treatment/procedure(s) were performed by non-physician practitioner and as supervising physician I was immediately available for consultation/collaboration.  Mitchell Epling, MD 12/23/12 2133 

## 2012-12-23 NOTE — ED Provider Notes (Signed)
History     CSN: [id number]  Arrival date & time 12/23/12  4098   First MD Initiated Contact with Patient 12/23/12 0930      Chief Complaint  Patient presents with  . Migraine    (Consider location/radiation/quality/duration/timing/severity/associated sxs/prior treatment) HPI Comments: 32 year old female with a history of migraines presents to the emergency department complaining of a constant migraine since last night. She tried taking Imitrex this morning without any relief. It was her last shot of Imitrex which was prescribed by [facility] for her previous migraine. Imitrex normally relieves her migraines which she has a few times per month. Admits to associated nausea and vomiting throughout the night which is normal for her. Also complaining of occasional blurry vision and Sparks in her vision. Rates pain 9.5/10 worse when she looks in the light.  Patient is a 32 y.o. female presenting with migraines. The history is provided by the patient.  Migraine Associated symptoms include headaches, nausea and vomiting. Pertinent negatives include no chest pain, chills, fever, neck pain, rash or weakness.    Past Medical History  Diagnosis Date  . Migraine     No past surgical history on file.  No family history on file.  History  Substance Use Topics  . Smoking status: Former Games developer  . Smokeless tobacco: Not on file  . Alcohol Use: No    OB History    Grav Para Term Preterm Abortions TAB SAB Ect Mult Living                  Review of Systems  Constitutional: Negative for fever and chills.  HENT: Negative for neck pain and neck stiffness.   Eyes: Positive for photophobia and visual disturbance.  Respiratory: Negative for shortness of breath.   Cardiovascular: Negative for chest pain.  Gastrointestinal: Positive for nausea and vomiting.  Genitourinary: Negative.   Musculoskeletal: Negative for back pain.  Skin: Negative for rash.  Neurological: Positive for  headaches. Negative for weakness.  Psychiatric/Behavioral: Negative for confusion.    Allergies  Review of patient's allergies indicates no known allergies.  Home Medications  No current outpatient prescriptions on file.  BP 120/79  Pulse 73  Temp 97.2 F (36.2 C)  Resp 18  SpO2 100%  Physical Exam  Nursing note and vitals reviewed. Constitutional: She is oriented to person, place, and time. She appears well-developed.       Obese, laying on side in room with light off.  HENT:  Head: Normocephalic and atraumatic.  Mouth/Throat: Oropharynx is clear and moist.  Eyes: Conjunctivae normal and EOM are normal. Pupils are equal, round, and reactive to light.  Neck: Normal range of motion. Neck supple.  Cardiovascular: Normal rate, regular rhythm, normal heart sounds and intact distal pulses.   Pulmonary/Chest: Effort normal and breath sounds normal.  Abdominal: Soft. Bowel sounds are normal. There is no tenderness.  Musculoskeletal: Normal range of motion. She exhibits no edema.  Neurological: She is alert and oriented to person, place, and time. She has normal strength. No cranial nerve deficit. Coordination normal.  Skin: Skin is warm and dry.  Psychiatric: She has a normal mood and affect. Her speech is normal and behavior is normal. Cognition and memory are normal.    ED Course  Procedures (including critical care time)  Labs Reviewed - No data to display No results found.   1. Migraine       MDM  32 y/o female with migraine consistent with her "normal" migraines.  No red flags on physical exam concerning headache. No focal neuro deficits. No speech changes. She is in NAD. Will give IV fluids, decadron, toradol, benadryl, reglan and reassess. 10:44 AM Patient beginning to feel better. No longer nauseated or vomiting. Headache decreased to 6/10. Will continue fluids and re-assess. 11:26 AM Patient sitting up with light on eating crackers. Headache decreased to 3/10. She  is feeling much better. Stable for discharge. Rx imitrex #10. Resource guide given for f/u to establish care with PCP. Patient states understanding of plan and is agreeable.       Trevor Mace, PA-C 12/23/12 1126

## 2012-12-23 NOTE — ED Notes (Signed)
Pt discharged to home with family. NAD.  

## 2012-12-23 NOTE — ED Notes (Signed)
Pt reports hx of migraines; took Imitrex this AM for headache; reports no relief. States usually takes a "shot in hip for relief". Reports is she gets up quickly can have blurry vision and had one episode of vomiting.

## 2013-01-14 ENCOUNTER — Encounter (HOSPITAL_COMMUNITY): Payer: Self-pay | Admitting: Adult Health

## 2013-01-14 ENCOUNTER — Emergency Department (HOSPITAL_COMMUNITY)
Admission: EM | Admit: 2013-01-14 | Discharge: 2013-01-14 | Disposition: A | Discharge status: Home / Self Care | Payer: Self-pay | Attending: Emergency Medicine | Admitting: Emergency Medicine

## 2013-01-14 DIAGNOSIS — L02411 Cutaneous abscess of right axilla: Secondary | ICD-10-CM

## 2013-01-14 DIAGNOSIS — G43909 Migraine, unspecified, not intractable, without status migrainosus: Secondary | ICD-10-CM | POA: Insufficient documentation

## 2013-01-14 DIAGNOSIS — IMO0002 Reserved for concepts with insufficient information to code with codable children: Secondary | ICD-10-CM | POA: Insufficient documentation

## 2013-01-14 DIAGNOSIS — Z7982 Long term (current) use of aspirin: Secondary | ICD-10-CM | POA: Insufficient documentation

## 2013-01-14 DIAGNOSIS — K089 Disorder of teeth and supporting structures, unspecified: Secondary | ICD-10-CM | POA: Insufficient documentation

## 2013-01-14 DIAGNOSIS — K0889 Other specified disorders of teeth and supporting structures: Secondary | ICD-10-CM

## 2013-01-14 DIAGNOSIS — Z87891 Personal history of nicotine dependence: Secondary | ICD-10-CM | POA: Insufficient documentation

## 2013-01-14 MED ORDER — CLINDAMYCIN HCL 150 MG PO CAPS
300.0000 mg | ORAL_CAPSULE | Freq: Once | ORAL | Status: AC
  Administered 2013-01-14: 300 mg via ORAL
  Filled 2013-01-14: qty 2

## 2013-01-14 MED ORDER — OXYCODONE-ACETAMINOPHEN 5-325 MG PO TABS
1.0000 | ORAL_TABLET | Freq: Once | ORAL | Status: AC
  Administered 2013-01-14: 1 via ORAL
  Filled 2013-01-14: qty 1

## 2013-01-14 MED ORDER — CLINDAMYCIN HCL 150 MG PO CAPS: 150.0000 mg | ORAL_CAPSULE | Freq: Four times a day (QID) | ORAL | Status: DC

## 2013-01-14 MED ORDER — OXYCODONE-ACETAMINOPHEN 5-325 MG PO TABS: 1.0000 | ORAL_TABLET | ORAL | Status: DC | PRN

## 2013-01-14 NOTE — ED Provider Notes (Signed)
History  This chart was scribed for Dione Booze, MD by Marlin Canary ED Scribe. The patient was seen in room TR11C/TR11C. Patient's care was started at 2247.  CSN: [id number]  Arrival date & time 01/14/13  2209   First MD Initiated Contact with Patient 01/14/13 2247      Chief Complaint  Patient presents with  . Dental Pain  . Abscess   The history is provided by the patient. No language interpreter was used.  Beverly [name] is a 32 y.o. female who presents to the Emergency Department complaining of constant severe left lower dental pain that started this morning. She rates the tooth pain a 10/10 and describes the pain as throbbing. She has tried Customer service manager for her dental pain without relief. She also complains of an abscess in her right axillary region. She states she noticed the abscess 3 days ago and has gradually worsened. She has tried warm compresses with some relief. She rates the abscess pain 10/10.   Past Medical History  Diagnosis Date  . Migraine     History reviewed. No pertinent past surgical history.  History reviewed. No pertinent family history.  History  Substance Use Topics  . Smoking status: Former Games developer  . Smokeless tobacco: Not on file  . Alcohol Use: No    No OB history provided.  Review of Systems  Constitutional: Negative for fever and chills.  HENT: Positive for dental problem. Negative for mouth sores.   Skin: Positive for wound.       Abscess   All other systems reviewed and are negative.    Allergies  Review of patient's allergies indicates no known allergies.  Home Medications   Current Outpatient Rx  Name  Route  Sig  Dispense  Refill  . ASPIRIN 325 MG PO TABS   Oral   Take 325 mg by mouth daily.         . SUMATRIPTAN SUCCINATE 100 MG PO TABS   Oral   Take 1 tablet (100 mg total) by mouth every 2 (two) hours as needed for migraine.   10 tablet   0     BP 100/79  Pulse 85  Temp 98.9 F (37.2 C) (Oral)  Resp 16  SpO2  95%  Physical Exam  Nursing note and vitals reviewed. Constitutional: She is oriented to person, place, and time. She appears well-developed and well-nourished. No distress.  HENT:  Head: Normocephalic and atraumatic.       Tooth #32 is parially impacted and tender to percussion  Eyes: Conjunctivae normal and EOM are normal.  Neck: Neck supple. No tracheal deviation present.  Cardiovascular: Normal rate.   Pulmonary/Chest: Effort normal. No respiratory distress.  Musculoskeletal: Normal range of motion.       Abscess in the right axilla   Neurological: She is alert and oriented to person, place, and time.  Skin: Skin is warm and dry.  Psychiatric: She has a normal mood and affect. Her behavior is normal.    ED Course  Procedures (including critical care time)  DIAGNOSTIC STUDIES: Oxygen Saturation is 95% on room air, adequate by my interpretation.    COORDINATION OF CARE: .[id number] informed of current plan for treatment and evaluation and agrees with plan at this time. Will have the PA preform an I&D of the abscess.    1. Pain, dental   2. Abscess of right axilla       MDM  Dental pain seems to be located in tooth #32 which  is partially impacted. She'll need to be referred to on call dental office and she may need oral surgery. Abscess will need incision and drainage which will be done by Rhea Bleacher PA-C    I personally performed the services described in this documentation, which was scribed in my presence. The recorded information has been reviewed and is accurate.        Dione Booze, MD 01/14/13 [phone]

## 2013-01-14 NOTE — ED Notes (Signed)
golfball sized induration to right axilla pain is worse with movement and induration has increased since Friday. Pt also complains of lower right dental pain with tooth that is rotted. Unable to afford the dental care she needs at this time.

## 2013-01-17 ENCOUNTER — Encounter (HOSPITAL_COMMUNITY): Payer: Self-pay | Admitting: *Deleted

## 2013-01-17 ENCOUNTER — Emergency Department (HOSPITAL_COMMUNITY)
Admission: EM | Admit: 2013-01-17 | Discharge: 2013-01-17 | Disposition: A | Discharge status: Home / Self Care | Payer: Self-pay | Attending: Emergency Medicine | Admitting: Emergency Medicine

## 2013-01-17 DIAGNOSIS — IMO0002 Reserved for concepts with insufficient information to code with codable children: Secondary | ICD-10-CM | POA: Insufficient documentation

## 2013-01-17 DIAGNOSIS — Z8679 Personal history of other diseases of the circulatory system: Secondary | ICD-10-CM | POA: Insufficient documentation

## 2013-01-17 DIAGNOSIS — L02419 Cutaneous abscess of limb, unspecified: Secondary | ICD-10-CM

## 2013-01-17 DIAGNOSIS — Z87891 Personal history of nicotine dependence: Secondary | ICD-10-CM | POA: Insufficient documentation

## 2013-01-17 MED ORDER — CLINDAMYCIN HCL 150 MG PO CAPS
300.0000 mg | ORAL_CAPSULE | Freq: Once | ORAL | Status: AC
  Administered 2013-01-17: 300 mg via ORAL
  Filled 2013-01-17: qty 2

## 2013-01-17 MED ORDER — ONDANSETRON 4 MG PO TBDP
8.0000 mg | ORAL_TABLET | Freq: Once | ORAL | Status: AC
  Administered 2013-01-17: 8 mg via ORAL
  Filled 2013-01-17: qty 2

## 2013-01-17 MED ORDER — HYDROCODONE-ACETAMINOPHEN 5-325 MG PO TABS: 1.0000 | ORAL_TABLET | Freq: Four times a day (QID) | ORAL | Status: DC | PRN

## 2013-01-17 MED ORDER — ONDANSETRON HCL 8 MG PO TABS: 8.0000 mg | ORAL_TABLET | Freq: Once | ORAL | Status: DC

## 2013-01-17 NOTE — ED Provider Notes (Signed)
History   Scribed for [name]. Ghim, MD, the patient was seen in room TR10C/TR10C . This chart was scribed by Lewanda Rife.   CSN: [id number]  Arrival date & time 01/17/13  1702   First MD Initiated Contact with Patient 01/17/13 1823      Chief Complaint  Patient presents with  . Abscess    (Consider location/radiation/quality/duration/timing/severity/associated sxs/prior treatment) HPI Beverly [name] is a 32 y.o. female who presents to the Emergency Department complaining of worsening abscess in right axilla onset 4 days. Pt reports pain as moderate. Pt denies fever and chills. Pt states she has used warm compress at home with mild relief. Pt reports she will get her antibiotics filled tomorrow when she gets paid.    Past Medical History  Diagnosis Date  . Migraine     History reviewed. No pertinent past surgical history.  No family history on file.  History  Substance Use Topics  . Smoking status: Former Games developer  . Smokeless tobacco: Not on file  . Alcohol Use: No    OB History    Grav Para Term Preterm Abortions TAB SAB Ect Mult Living                  Review of Systems  Constitutional: Negative.  Negative for fever and chills.  HENT: Negative.   Respiratory: Negative.   Cardiovascular: Negative.   Gastrointestinal: Negative.   Musculoskeletal: Negative.   Skin: Positive for wound (right axilla abscess).  Neurological: Negative.   Hematological: Negative.   Psychiatric/Behavioral: Negative.   All other systems reviewed and are negative.  A complete 10 system review of systems was obtained and all systems are negative except as noted in the HPI and PMH.    Allergies  Review of patient's allergies indicates no known allergies.  Home Medications  No current outpatient prescriptions on file.  BP 131/84  Pulse 94  Temp 98.6 F (37 C) (Oral)  Resp 20  SpO2 96%  Physical Exam  Nursing note and vitals reviewed. Constitutional: She is  oriented to person, place, and time. She appears well-developed and well-nourished. No distress.  HENT:  Head: Normocephalic and atraumatic.  Eyes: Conjunctivae normal and EOM are normal. No scleral icterus.  Neck: Neck supple. No tracheal deviation present.  Cardiovascular: Normal rate and regular rhythm.   Pulmonary/Chest: Effort normal and breath sounds normal. No respiratory distress.  Abdominal: Soft.  Musculoskeletal: Normal range of motion.  Neurological: She is alert and oriented to person, place, and time.  Skin: Skin is warm and dry. She is not diaphoretic. There is erythema.       Oblong abscess noted in right axilla about 5 cm in diameter and mildly tender on palpation  Psychiatric: She has a normal mood and affect. Her behavior is normal.    ED Course  Procedures (including critical care time)  Labs Reviewed - No data to display No results found.   No diagnosis found.  Axillary abscess.  Has prescription for clindamycin from previous visit.  Return tomorrow for recheck and packing removal.  MDM    I personally performed the services described in this documentation, which was scribed in my presence. The recorded information has been reviewed and is accurate.       Jimmye Norman, NP 01/17/13 2300

## 2013-01-17 NOTE — ED Notes (Signed)
Pt is here for re-evaluation of abscess under right axilla.  Pt states that it has gotten worse, she has not been able to fill her antibiotic prescription yet.

## 2013-01-17 NOTE — ED Provider Notes (Signed)
INCISION AND DRAINAGE Performed by: Dierdre Forth Consent: Verbal consent obtained. Risks and benefits: risks, benefits and alternatives were discussed Type: abscess  Body area: R axilla  Anesthesia: local infiltration  Incision was made with a scalpel.  Local anesthetic: lidocaine 2% with epinephrine  Anesthetic total: 15 ml  Complexity: complex Blunt dissection to break up loculations  Drainage: purulent  Drainage amount: large amount of purulent drainage  Packing material: 1/4 in iodoform gauze  Patient tolerance: Patient tolerated the procedure well with no immediate complications.     Dierdre Forth, PA-C 01/17/13 2127

## 2013-01-17 NOTE — ED Notes (Signed)
Pt reporting new onset nausea. NP Onalee Hua made aware.

## 2013-01-17 NOTE — ED Notes (Signed)
PT seen here 3 days ago for same. States they were unable to drain abscess at that time. Unable to fill RX due to financial issues. Pt reports increase in right axilla pain to 10/10. Denies drainage. Pt reports putting warm compress on at home with no relief.

## 2013-01-17 NOTE — ED Notes (Signed)
PA at bedside.

## 2013-01-23 NOTE — ED Provider Notes (Signed)
Medical screening examination/treatment/procedure(s) were performed by non-physician practitioner and as supervising physician I was immediately available for consultation/collaboration.   Pasqualino Witherspoon Y. Hearl Heikes, MD 01/23/13 1440 

## 2013-01-23 NOTE — ED Provider Notes (Signed)
Medical screening examination/treatment/procedure(s) were performed by non-physician practitioner and as supervising physician I was immediately available for consultation/collaboration.   Ramatoulaye Pack Y. Gionni Freese, MD 01/23/13 1440 

## 2014-01-22 ENCOUNTER — Encounter (HOSPITAL_COMMUNITY): Payer: Self-pay | Admitting: Emergency Medicine

## 2014-01-22 ENCOUNTER — Emergency Department (HOSPITAL_COMMUNITY)
Admission: EM | Admit: 2014-01-22 | Discharge: 2014-01-22 | Disposition: A | Discharge status: Home / Self Care | Payer: Self-pay | Attending: Emergency Medicine | Admitting: Emergency Medicine

## 2014-01-22 DIAGNOSIS — G43909 Migraine, unspecified, not intractable, without status migrainosus: Secondary | ICD-10-CM | POA: Insufficient documentation

## 2014-01-22 DIAGNOSIS — Z87891 Personal history of nicotine dependence: Secondary | ICD-10-CM | POA: Insufficient documentation

## 2014-01-22 DIAGNOSIS — Z79899 Other long term (current) drug therapy: Secondary | ICD-10-CM | POA: Insufficient documentation

## 2014-01-22 MED ORDER — DIPHENHYDRAMINE HCL 50 MG/ML IJ SOLN
25.0000 mg | Freq: Once | INTRAMUSCULAR | Status: AC
  Administered 2014-01-22: 25 mg via INTRAVENOUS
  Filled 2014-01-22: qty 1

## 2014-01-22 MED ORDER — DEXAMETHASONE SODIUM PHOSPHATE 10 MG/ML IJ SOLN
10.0000 mg | Freq: Once | INTRAMUSCULAR | Status: AC
  Administered 2014-01-22: 10 mg via INTRAVENOUS
  Filled 2014-01-22: qty 1

## 2014-01-22 MED ORDER — METOCLOPRAMIDE HCL 5 MG/ML IJ SOLN
10.0000 mg | Freq: Once | INTRAMUSCULAR | Status: AC
  Administered 2014-01-22: 10 mg via INTRAVENOUS
  Filled 2014-01-22: qty 2

## 2014-01-22 MED ORDER — IBUPROFEN 400 MG PO TABS
400.0000 mg | ORAL_TABLET | Freq: Once | ORAL | Status: AC
  Administered 2014-01-22: 400 mg via ORAL
  Filled 2014-01-22: qty 1

## 2014-01-22 NOTE — ED Provider Notes (Signed)
CSN: [id number]     Arrival date & time 01/22/14  1414 History   First MD Initiated Contact with Patient 01/22/14 1652     Chief Complaint  Patient presents with  . Migraine    HPI: Beverly [name] is a 33 yo F with history of migraines who presents with typical headache for her. Symptoms started two days ago with gradual onset, left sided headache. Described as throbbing, non-radiating, worse with light exposure, relieved partially with Tylenol. Pain has been constant since that time. She did have neck pain initially but this has since resolved. She denies any neurologic deficits. Pain is currently a 8/10. She has not had fever, chills, respiratory symptoms, diarrhea, vomiting. She has been worked up for migraine in the past, negative head imaging previously.    Past Medical History  Diagnosis Date  . Migraine    History reviewed. No pertinent past surgical history. No family history on file. History  Substance Use Topics  . Smoking status: Former Games developer  . Smokeless tobacco: Not on file  . Alcohol Use: No   OB History   Grav Para Term Preterm Abortions TAB SAB Ect Mult Living                 Review of Systems  Constitutional: Negative for fever, chills, appetite change and fatigue.  Eyes: Positive for photophobia. Negative for visual disturbance.  Respiratory: Negative for cough and shortness of breath.   Cardiovascular: Negative for chest pain and leg swelling.  Gastrointestinal: Negative for nausea, vomiting, abdominal pain, diarrhea and constipation.  Genitourinary: Negative for dysuria, frequency and decreased urine volume.  Musculoskeletal: Positive for neck pain (two days ago, now resolved). Negative for arthralgias, back pain, gait problem and myalgias.  Skin: Negative for color change and wound.  Neurological: Positive for headaches. Negative for dizziness, syncope and light-headedness.  Psychiatric/Behavioral: Negative for confusion and agitation.  All other systems  reviewed and are negative.    Allergies  Review of patient's allergies indicates no known allergies.  Home Medications   Current Outpatient Rx  Name  Route  Sig  Dispense  Refill  . acetaminophen (TYLENOL) 500 MG tablet   Oral   Take 500 mg by mouth every 6 (six) hours as needed.         . SUMAtriptan Succinate (IMITREX PO)   Oral   Take 1 tablet by mouth once.          BP 124/65  Pulse 68  Temp(Src) 98.1 F (36.7 C) (Oral)  Resp 18  SpO2 100% Physical Exam  Nursing note and vitals reviewed. Constitutional: She is oriented to person, place, and time. She appears well-developed and well-nourished. No distress.  HENT:  Head: Normocephalic and atraumatic.  Mouth/Throat: Oropharynx is clear and moist.  Eyes: Conjunctivae and EOM are normal. Pupils are equal, round, and reactive to light.  Neck: Normal range of motion. Neck supple.  Cardiovascular: Normal rate, regular rhythm, normal heart sounds and intact distal pulses.   Pulmonary/Chest: Effort normal and breath sounds normal. No respiratory distress.  Abdominal: Soft. Bowel sounds are normal. There is no tenderness. There is no rebound and no guarding.  Musculoskeletal: Normal range of motion. She exhibits no edema and no tenderness.  Neurological: She is alert and oriented to person, place, and time. She has normal strength and normal reflexes. No cranial nerve deficit or sensory deficit. Coordination and gait normal. GCS eye subscore is 4. GCS verbal subscore is 5. GCS motor subscore is 6.  CN 3-12 intact, no deficit. No pronator drift. Strength 5/5 in all extremities. DTR's 2+ and symmetric. Finger to nose and heel to shin normal. Gait normal.   Skin: Skin is warm and dry. No rash noted.  Psychiatric: She has a normal mood and affect. Her behavior is normal.    ED Course  Procedures (including critical care time) Labs Review Labs Reviewed - No data to display Imaging Review No results found.  EKG  Interpretation   None       MDM   33 yo F with history of migraines presents with typical headache for her. Pain gradual in onset, no neck pain, doubt ICH or dissection. No fever, neck supple, doubt meningitis. Normal neurologic exam, no indication for imaging today. Treated with migraine cocktail, marked improved of her symptoms. She remained HD stable, no new symptoms in the ED. Felt she was stable for outpatient management. Advised PCP follow up if headache continue. Return precautions given. Patient was in agreement with plan.   Reviewed previous medical records, utilized in MDM  Discussed case with Dr. Lynelle DoctorKnapp  Clinical Impression 1. Migraine     Beverly BilletMathias Quinlynn Cuthbert, MD 01/23/14 [phone]

## 2014-01-22 NOTE — Discharge Instructions (Signed)
If pain returns, take Motrin and drink something with caffeine.   Migraine Headache A migraine headache is an intense, throbbing pain on one or both sides of your head. A migraine can last for 30 minutes to several hours. CAUSES  The exact cause of a migraine headache is not always known. However, a migraine may be caused when nerves in the brain become irritated and release chemicals that cause inflammation. This causes pain. Certain things may also trigger migraines, such as:  Alcohol.  Smoking.  Stress.  Menstruation.  Aged cheeses.  Foods or drinks that contain nitrates, glutamate, aspartame, or tyramine.  Lack of sleep.  Chocolate.  Caffeine.  Hunger.  Physical exertion.  Fatigue.  Medicines used to treat chest pain (nitroglycerine), birth control pills, estrogen, and some blood pressure medicines. SIGNS AND SYMPTOMS  Pain on one or both sides of your head.  Pulsating or throbbing pain.  Severe pain that prevents daily activities.  Pain that is aggravated by any physical activity.  Nausea, vomiting, or both.  Dizziness.  Pain with exposure to bright lights, loud noises, or activity.  General sensitivity to bright lights, loud noises, or smells. Before you get a migraine, you may get warning signs that a migraine is coming (aura). An aura may include:  Seeing flashing lights.  Seeing bright spots, halos, or zig-zag lines.  Having tunnel vision or blurred vision.  Having feelings of numbness or tingling.  Having trouble talking.  Having muscle weakness. DIAGNOSIS  A migraine headache is often diagnosed based on:  Symptoms.  Physical exam.  A CT scan or MRI of your head. These imaging tests cannot diagnose migraines, but they can help rule out other causes of headaches. TREATMENT Medicines may be given for pain and nausea. Medicines can also be given to help prevent recurrent migraines.  HOME CARE INSTRUCTIONS  Only take over-the-counter or  prescription medicines for pain or discomfort as directed by your health care provider. The use of long-term narcotics is not recommended.  Lie down in a dark, quiet room when you have a migraine.  Keep a journal to find out what may trigger your migraine headaches. For example, write down:  What you eat and drink.  How much sleep you get.  Any change to your diet or medicines.  Limit alcohol consumption.  Quit smoking if you smoke.  Get 7 9 hours of sleep, or as recommended by your health care provider.  Limit stress.  Keep lights dim if bright lights bother you and make your migraines worse. SEEK IMMEDIATE MEDICAL CARE IF:   Your migraine becomes severe.  You have a fever.  You have a stiff neck.  You have vision loss.  You have muscular weakness or loss of muscle control.  You start losing your balance or have trouble walking.  You feel faint or pass out.  You have severe symptoms that are different from your first symptoms. MAKE SURE YOU:   Understand these instructions.  Will watch your condition.  Will get help right away if you are not doing well or get worse. Document Released: 12/05/2005 Document Revised: 09/25/2013 Document Reviewed: 08/12/2013 [facility] Patient Information 2014 [place], [place].

## 2014-01-22 NOTE — ED Notes (Signed)
MD notified of patient's desire to go home.

## 2014-01-22 NOTE — ED Notes (Addendum)
Patient reports having a migraine x3 days. Patient states she has had nausea, vomiting, and dizziness as well. Patient denies seeing spots or losing consciousness. Patient reports feeling pressure in the back of her head radiating to the sides. Patient reports pain "letting up" before now. Per patient, earlier the pain was a "15/10 and I felt like my head was going to explode." Patient is comfortable and sitting in room with lights out. No family present at the bedside.

## 2014-01-22 NOTE — ED Notes (Signed)
Pt reports migraine x 3 days. Hx of same. Nausea/vomiting/photosensitivity.

## 2014-01-24 NOTE — ED Provider Notes (Signed)
I saw and evaluated the patient, reviewed the resident's note and I agree with the findings and plan.  Pt treated for migraine headache.  Improved after treatment. No concern for SAH, meningitis.  Celene KrasJon R Lazaria Schaben, MD 01/24/14 [phone]

## 2014-03-13 ENCOUNTER — Encounter (HOSPITAL_COMMUNITY): Payer: Self-pay | Admitting: Emergency Medicine

## 2014-03-13 ENCOUNTER — Emergency Department (HOSPITAL_COMMUNITY)
Admission: EM | Admit: 2014-03-13 | Discharge: 2014-03-14 | Disposition: A | Discharge status: Home / Self Care | Payer: Self-pay | Attending: Emergency Medicine | Admitting: Emergency Medicine

## 2014-03-13 DIAGNOSIS — IMO0002 Reserved for concepts with insufficient information to code with codable children: Secondary | ICD-10-CM | POA: Insufficient documentation

## 2014-03-13 DIAGNOSIS — G43909 Migraine, unspecified, not intractable, without status migrainosus: Secondary | ICD-10-CM | POA: Insufficient documentation

## 2014-03-13 DIAGNOSIS — B373 Candidiasis of vulva and vagina: Secondary | ICD-10-CM | POA: Insufficient documentation

## 2014-03-13 DIAGNOSIS — B3731 Acute candidiasis of vulva and vagina: Secondary | ICD-10-CM | POA: Insufficient documentation

## 2014-03-13 DIAGNOSIS — F17200 Nicotine dependence, unspecified, uncomplicated: Secondary | ICD-10-CM | POA: Insufficient documentation

## 2014-03-13 DIAGNOSIS — L02412 Cutaneous abscess of left axilla: Secondary | ICD-10-CM

## 2014-03-13 DIAGNOSIS — Z79899 Other long term (current) drug therapy: Secondary | ICD-10-CM | POA: Insufficient documentation

## 2014-03-13 NOTE — ED Notes (Signed)
Pt states she has an abscess under her left arm  Pt states it started yesterday and today it is bigger  Pt states she is also having a little discharge and itching to her vaginal area and would like that checked as well  Discharge is clear to white in color

## 2014-03-14 LAB — WET PREP, GENITAL: TRICH WET PREP: NONE SEEN

## 2014-03-14 LAB — GC/CHLAMYDIA PROBE AMP
CT Probe RNA: NEGATIVE
GC PROBE AMP APTIMA: NEGATIVE

## 2014-03-14 MED ORDER — FLUCONAZOLE 150 MG PO TABS: 150.0000 mg | ORAL_TABLET | Freq: Once | ORAL | Status: DC

## 2014-03-14 NOTE — Discharge Instructions (Signed)
1. Medications: diflucan, usual home medications 2. Treatment: rest, drink plenty of fluids,  3. Follow Up: Please followup with your OB/GYN for discussion of your diagnoses and further evaluation after today's visit; if you do not have a primary care doctor use the resource guide provided to find one;    Candidal Vulvovaginitis Candidal vulvovaginitis is an infection of the vagina and vulva. The vulva is the skin around the opening of the vagina. This may cause itching and discomfort in and around the vagina.  HOME CARE  Only take medicine as told by your doctor.  Do not have sex (intercourse) until the infection is healed or as told by your doctor.  Practice safe sex.  Tell your sex partner about your infection.  Do not douche or use tampons.  Wear cotton underwear. Do not wear tight pants or panty hose.  Eat yogurt. This may help treat and prevent yeast infections. GET HELP RIGHT AWAY IF:   You have a fever.  Your problems get worse during treatment or do not get better in 3 days.  You have discomfort, irritation, or itching in your vagina or vulva area.  You have pain after sex.  You start to get belly (abdominal) pain. MAKE SURE YOU:  Understand these instructions.  Will watch your condition.  Will get help right away if you are not doing well or get worse. Document Released: 03/03/2009 Document Revised: 02/27/2012 Document Reviewed: 03/03/2009 [facility] Patient Information 2014 [place], [place].  Abscess An abscess (boil or furuncle) is an infected area on or under the skin. This area is filled with yellowish-white fluid (pus) and other material (debris). HOME CARE   Only take medicines as told by your doctor.  If you were given antibiotic medicine, take it as directed. Finish the medicine even if you start to feel better.  If gauze is used, follow your doctor's directions for changing the gauze.  To avoid spreading the infection:  Keep your abscess covered  with a bandage.  Wash your hands well.  Do not share personal care items, towels, or whirlpools with others.  Avoid skin contact with others.  Keep your skin and clothes clean around the abscess.  Keep all doctor visits as told. GET HELP RIGHT AWAY IF:   You have more pain, puffiness (swelling), or redness in the wound site.  You have more fluid or blood coming from the wound site.  You have muscle aches, chills, or you feel sick.  You have a fever. MAKE SURE YOU:   Understand these instructions.  Will watch your condition.  Will get help right away if you are not doing well or get worse. Document Released: 05/23/2008 Document Revised: 06/05/2012 Document Reviewed: 02/17/2012 [facility] Patient Information 2014 [place], [place].    Emergency Department Resource Guide 1) Find a Doctor and Pay Out of Pocket Although you won't have to find out who is covered by your insurance plan, it is a good idea to ask around and get recommendations. You will then need to call the office and see if the doctor you have chosen will accept you as a new patient and what types of options they offer for patients who are self-pay. Some doctors offer discounts or will set up payment plans for their patients who do not have insurance, but you will need to ask so you aren't surprised when you get to your appointment.  2) Contact Your Local Health Department Not all health departments have doctors that can see patients for sick  visits, but many do, so it is worth a call to see if yours does. If you don't know where your local health department is, you can check in your phone book. The CDC also has a tool to help you locate your state's health department, and many state websites also have listings of all of their local health departments.  3) Find a Walk-in Clinic If your illness is not likely to be very severe or complicated, you may want to try a walk in clinic. These are popping up all over the country  in pharmacies, drugstores, and shopping centers. They're usually staffed by nurse practitioners or physician assistants that have been trained to treat common illnesses and complaints. They're usually fairly quick and inexpensive. However, if you have serious medical issues or chronic medical problems, these are probably not your best option.  No Primary Care Doctor: - Call Health Connect at  [phone] - they can help you locate a primary care doctor that  accepts your insurance, provides certain services, etc. - Physician Referral Service- [phone]  Chronic Pain Problems: Organization         Address  Phone   Notes  [facility]  [phone] Patients need to be referred by their primary care doctor.   Medication Assistance: Organization         Address  Phone   Notes  [facility] Medication [facility] [address] [place] [place], [place] [address] [phone] --Must be a resident of [facility] -- Must have NO insurance coverage whatsoever (no Medicaid/ Medicare, etc.) -- The pt. MUST have a primary care doctor that directs their care regularly and follows them in the community   [facility]  [phone]   [employer]  [phone]    Agencies that provide inexpensive medical care: Organization         Address  Phone   Notes  [facility]  [phone]   [facility]    [phone]   [facility] [address] [place], [place] [address] [phone]   [facility] [address] [place] [place] [phone]   Planned Parenthood    [phone]   [facility]    [phone]   Community Health and [facility]  [address], [place] Phone:  [phone], Fax:  [phone] Hours of Operation:  9 am - 6 pm, M-F.  Also accepts Medicaid/Medicare and self-pay.  [facility]  [address],  [place] Phone: [phone], Fax: [phone]. Hours of Operation:  8:30 am - 5:30 pm, M-F.  Also accepts Medicaid and self-pay.  [facility] [place] [address], [place] Phone: [phone]   [facility] [address] Natasha Bence [place], [place] [phone], Ext. [phone] Mondays & Thursdays: 7-9 AM.  First 15 patients are seen on a first come, first serve basis.    Medicaid-accepting [facility] Providers:  Organization         Address  Phone   Notes  [facility] [address], [place] [phone] Also accepts self-pay patients.  [facility] [address] Laurell Josephs [place], [place]  [phone]   [facility] [address], [place] [phone]   [facility] [address], [place] [phone]  Renaye Rakers [address], [place]   [phone] Only accepts [place] patients after they have their name applied to their card.   Self-Pay (no insurance) in [facility]:  Organization         Address  Phone   Notes  Sickle Cell Patients, [facility] Medicine [address] [place], [place] [phone]   [facility] [address] [place], [place] [phone]   [facility] [place]  [address], [place] [phone]   [facility]/Dr. Osei-Bonsu  [address], [place] or [address], [place] [phone] Phone number for both [place] and [place] locations is the same.  [facility]gent [facility] and [facility] [address] [place] [phone]   [facility] [address], [place] or [address] [phone]   [facility] [address], [place] [phone], phone; [phone], fax Sees patients 1st and 3rd Saturday of every month.  Must not  qualify for public or private insurance (i.e. Medicaid, Medicare, [employer] Health Choice, Veterans' Benefits)  Household income should be no more than 200% of the poverty level The clinic cannot treat you if you are pregnant or think you are pregnant  Sexually transmitted diseases are not treated at the clinic.    Dental Care: Organization         Address  Phone  Notes  [facility] Department of [facility] [address] [place], [place] [phone] Accepts children up to age 74 who are enrolled in [place] or [place] Health Choice; pregnant women with a Medicaid card; and children who have applied for Medicaid or [place] Health Choice, but were declined, whose parents can pay a reduced fee at time of service.  [facility] Department of [facility]  [address], [place] [phone] Accepts children up to age 52 who are enrolled in [place] or [place] Health Choice; pregnant women with a Medicaid card; and children who have applied for Medicaid or  Health Choice, but were declined, whose parents can pay a reduced fee at time of service.  [facility]  [address] [place], [place] [phone] Patients are seen by appointment only. Walk-ins are not accepted. [facility] will see patients 56 years of age and older. Monday - Tuesday (8am-5pm) Most Wednesdays (8:30-5pm) $30 per visit, cash only  [facility] Adult Dental Access PROGRAM  [address], [facility] [phone] Patients are seen by appointment only. Walk-ins are not accepted. [facility] will see patients 73 years of age and older. One Wednesday Evening (Monthly: Volunteer Based).  $30 per visit, cash only  [employer]  [phone] for adults; Children under age 53, call Graduate Pediatric Dentistry at [phone]. Children aged 102-14, please call [phone] to request a pediatric application.  Dental services are provided  in all areas of dental care including fillings, crowns and bridges, complete and partial dentures, implants, gum treatment, root canals, and extractions. Preventive care is also provided. Treatment is provided to both adults and children. Patients are selected via a lottery and there is often a waiting list.   [facility] [address] [place]  [phone] [email or web address]   [facility] [address] [place], [place] [phone], Ext.  123 Second and Fourth Thursday of each month, opens at 6:30 AM; Clinic ends at 9 AM.  Patients are seen on a first-come first-served basis, and a limited number are seen during each clinic.   [facility]  [address] Ether Griffins [place], [place] [phone]   Eligibility Requirements You must have lived in [place], [place], or [place] counties for at least the last three months.   You cannot be eligible for state or federal sponsored [place], including [employer], [place], or [employer].   You generally cannot be eligible for healthcare insurance through your employer.    How to apply: Eligibility screenings are held every Tuesday and Wednesday afternoon from 1:00 pm until 4:00 pm. You do not need an appointment for the interview!  [facility] [address] [place], [place] [phone]   [facility] Health Department  [phone]   [facility] Health Department  [phone]   [facility] Health Department  [phone]    Behavioral Health Resources in the Community: Intensive Outpatient Programs Organization         Address  Phone  Notes  [facility] [address] [place], [place] [phone]   [facility] [address], [place], [place] [phone]   [facility] [address] [place], [place]  [phone]   [facility] [address],  [place], [place]  [phone] or [phone]   Substance Abuse Resources Organization         Address  Phone  Notes  [facility]  [phone]   [facility]  [phone]   [facility]  [phone]   Floydene Flock  [phone]   Residential & Outpatient Substance Abuse Program  [phone]   Psychological Services Organization         Address  Phone  Notes  [facility]  [phone]   [facility]  [phone]   [facility] [address], [place] [phone] or [phone]    Mobile Crisis Teams Organization         Address  Phone  Notes  [facility], Mobile Crisis Care Unit  [phone]   [facility]  [address] [place], [place] [phone]   Doristine Locks [address] [place] [phone]    Self-Help/Support Groups Organization         Address  Phone             Notes  [facility] [place] - variety of support groups  336- I7437963 Call for more information  Narcotics Anonymous (NA), [facility] [address], [employer] [place]  2 meetings at this location   Statistician         Address  Phone  Notes  [facility] [address],    [place]  [phone]   [facility]  [address], [place] [address], [place], [place] [phone]   [facility] [address] [place], [place] [phone] Admissions: 8am-3pm M-F  [facility] [address],    [place], [place] [phone]   [facility] [address] Starling Manns [place], [place] [phone]   [facility] [address]  [place], [place] [phone]   [facility] - Intensive Outpatient [address] Laurell Josephs [address], [place], [place] [phone]   [facility]  [address]  [place], [place] [phone] or  [phone]   [facility] ([facility]) [address] [place], [place] [phone] Accepts Medicaid  [facility] [address]  [place] [phone] Substance Abuse/Addiction Treatment   [facility] Behavioral Health Resources Organization         Address  Phone  Notes  [facility]  [phone]   Angie FavaJulie Brannon, PhD [address] [place], [place]   [phone]) [phone] or [phone]) [phone]   [facility]   [address] [place], [place] [phone]) [phone]   [facility] [address], [place], [place] [phone]) [phone] Insurance/Medicaid/sponsorship through [facility] [address]                                    [place], [place] [phone]) [phone] Therapy/tele-psych/case  [facility] [address]   [place], [place] [phone]) [phone]    Dr. Lolly MustacheArfeen  [phone]) [phone]   [facility] [place]  [employer] [facility] 1) [address], [place] 2) [address], [place] 3)  [address], [place] [phone]) [phone]  [phone](336) [phone]   [facility] Child Abuse Hotline [phone]) [phone] or [phone](336) [phone] (After Hours)

## 2014-03-14 NOTE — ED Provider Notes (Signed)
Medical screening examination/treatment/procedure(s) were performed by non-physician practitioner and as supervising physician I was immediately available for consultation/collaboration.   EKG Interpretation None       Derwood KaplanAnkit Arriel Victor, MD 03/14/14 [phone]

## 2014-03-14 NOTE — ED Provider Notes (Signed)
CSN: [id number]     Arrival date & time 03/13/14  2102 History   First MD Initiated Contact with Patient 03/13/14 2328     Chief Complaint  Patient presents with  . Abscess  . Vaginal Discharge     (Consider location/radiation/quality/duration/timing/severity/associated sxs/prior Treatment) Patient is a 33 y.o. female presenting with abscess and vaginal discharge. The history is provided by the patient and medical records. No language interpreter was used.  Abscess Associated symptoms: no fatigue, no fever, no nausea and no vomiting   Vaginal Discharge Associated symptoms: no abdominal pain, no dysuria, no fever, no nausea and no vomiting     Beverly [name] is a 33 y.o. female  with a hx of migraine headache presents to the Emergency Department complaining of gradual, persistent, progressively worsening left axilla abscess onset this morning. Patient reports she noticed it early this morning and thought it was a "pimple" but has gotten worse throughout the day. She reports she had an abscess similar to this several years ago.  She also reports that she has been working out every day walking for a least one hour and sweating. She reports that she uses her workout clothes 2 days and around after letting and dry and is sometimes she runs errands in her right workout clothes afterwards.  Patient reports vaginal and labial itching similar to previous yeast infections. She reports her last sexual partner was early January. She reports distant history of trichomonas but no other STDs.  Patient denies fever, chills, headache, neck pain, chest pain, shortness of breath, abdominal pain, nausea, vomiting, diarrhea, weakness, dizziness, syncope, dysuria, hematuria.  She has no known alleviating or aggravating factors at this time.     Past Medical History  Diagnosis Date  . Migraine    History reviewed. No pertinent past surgical history. Family History  Problem Relation Age of Onset  .  Hypertension Other   . Diabetes Other   . Cancer Other    History  Substance Use Topics  . Smoking status: Current Every Day Smoker    Types: Cigarettes  . Smokeless tobacco: Not on file  . Alcohol Use: No   OB History   Grav Para Term Preterm Abortions TAB SAB Ect Mult Living                 Review of Systems  Constitutional: Negative for fever, chills, diaphoresis, appetite change, fatigue and unexpected weight change.  Respiratory: Negative for chest tightness and shortness of breath.   Cardiovascular: Negative for chest pain.  Gastrointestinal: Negative for nausea, vomiting, abdominal pain, diarrhea, constipation, blood in stool, abdominal distention and rectal pain.  Genitourinary: Positive for vaginal discharge. Negative for dysuria, urgency, frequency, hematuria, flank pain, vaginal bleeding, difficulty urinating, vaginal pain, menstrual problem and pelvic pain.  Musculoskeletal: Negative for back pain, neck pain and neck stiffness.  Skin: Positive for rash.  Allergic/Immunologic: Negative for immunocompromised state.  Neurological: Negative for dizziness, weakness and light-headedness.  Hematological: Does not bruise/bleed easily.  Psychiatric/Behavioral: The patient is not nervous/anxious.   All other systems reviewed and are negative.      Allergies  Review of patient's allergies indicates no known allergies.  Home Medications   Current Outpatient Rx  Name  Route  Sig  Dispense  Refill  . SUMAtriptan Succinate (IMITREX PO)   Oral   Take 1 tablet by mouth as directed.          . fluconazole (DIFLUCAN) 150 MG tablet   Oral  Take 1 tablet (150 mg total) by mouth once.   1 tablet   1    BP 138/82  Pulse 73  Temp(Src) 98.5 F (36.9 C) (Oral)  Resp 20  SpO2 98% Physical Exam  Nursing note and vitals reviewed. Constitutional: She is oriented to person, place, and time. She appears well-developed and well-nourished. No distress.  HENT:  Head:  Normocephalic and atraumatic.  Eyes: Conjunctivae are normal. No scleral icterus.  Neck: Normal range of motion. Neck supple.  Cardiovascular: Normal rate, regular rhythm, normal heart sounds and intact distal pulses.   No murmur heard. Pulmonary/Chest: Effort normal and breath sounds normal. No respiratory distress. She has no wheezes.  Clear and equal breath sounds  Abdominal: Soft. Bowel sounds are normal. She exhibits no distension and no mass. There is no hepatosplenomegaly. There is no tenderness. There is no rebound and no CVA tenderness. Hernia confirmed negative in the right inguinal area and confirmed negative in the left inguinal area.  Abdomen soft and nontender  Genitourinary: Uterus normal. Pelvic exam was performed with patient supine. There is no rash, tenderness or lesion on the right labia. There is no rash, tenderness or lesion on the left labia. Uterus is not deviated, not enlarged, not fixed and not tender. Cervix exhibits no motion tenderness, no discharge and no friability. Right adnexum displays no mass, no tenderness and no fullness. Left adnexum displays no mass, no tenderness and no fullness. No erythema, tenderness or bleeding around the vagina. No foreign body around the vagina. No signs of injury around the vagina. Vaginal discharge (thick, white, moderate, nonodorous) found.  Musculoskeletal: Normal range of motion. She exhibits no edema.  Lymphadenopathy:    She has no cervical adenopathy.       Right: No inguinal adenopathy present.       Left: No inguinal adenopathy present.  Neurological: She is alert and oriented to person, place, and time.  Speech is clear and goal oriented Moves extremities without ataxia  Skin: Skin is warm and dry. No rash noted. She is not diaphoretic. There is erythema.  2 x 3 area of induration and erythema with central fluctuance in the left axilla  Psychiatric: She has a normal mood and affect.    ED Course  INCISION AND  DRAINAGE Date/Time: 03/14/2014 12:21 AM Performed by: Dierdre Forth Authorized by: Dierdre Forth Consent: Verbal consent obtained. Risks and benefits: risks, benefits and alternatives were discussed Consent given by: patient Patient understanding: patient states understanding of the procedure being performed Patient consent: the patient's understanding of the procedure matches consent given Procedure consent: procedure consent matches procedure scheduled Relevant documents: relevant documents present and verified Site marked: the operative site was marked Required items: required blood products, implants, devices, and special equipment available Patient identity confirmed: verbally with patient and arm band Time out: Immediately prior to procedure a "time out" was called to verify the correct patient, procedure, equipment, support staff and site/side marked as required. Type: abscess Location: left axilla. Anesthesia: local infiltration Local anesthetic: lidocaine 2% without epinephrine Anesthetic total: 3 ml Patient sedated: no Risk factor: underlying major vessel Scalpel size: 11 Incision type: single straight Complexity: complex Drainage: purulent Drainage amount: copious Wound treatment: wound left open Patient tolerance: Patient tolerated the procedure well with no immediate complications.   (including critical care time) Labs Review Labs Reviewed  WET PREP, GENITAL - Abnormal; Notable for the following:    Yeast Wet Prep HPF POC RARE (*)    Clue Cells  Wet Prep HPF POC MODERATE (*)    WBC, Wet Prep HPF POC RARE (*)    All other components within normal limits  GC/CHLAMYDIA PROBE AMP   Imaging Review No results found.   EKG Interpretation None      MDM   Final diagnoses:  Abscess of left axilla  Vaginal candida   Zilda Burnell BlanksM Vanpelt presents with vaginal itching and discharge in addition to the left axilla abscess.  Patient with skin abscess  amenable to incision and drainage.  Abscess was not large enough to warrant packing or drain,  wound recheck in 2 days. Encouraged home warm soaks and flushing.  Mild signs of cellulitis is surrounding skin. No antibiotic therapy is indicated.  Patient with yeast and moderate clue cells on wet prep. Only rare white blood cells. Patient low risk for STD. Discussed importance of using protection when sexually active. Pt understands that they have GC/Chlamydia cultures pending and that they will need to inform all sexual partners if results return positive. Pt not concerning for PID because hemodynamically stable and no cervical motion tenderness on pelvic exam. Pt will be treated for a yeast infection.  Patient to be discharged with instructions to follow up with PCP or OBGYN.  It has been determined that no acute conditions requiring further emergency intervention are present at this time. The patient/guardian have been advised of the diagnosis and plan. We have discussed signs and symptoms that warrant return to the ED, such as changes or worsening in symptoms.   Vital signs are stable at discharge.   BP 138/82  Pulse 73  Temp(Src) 98.5 F (36.9 C) (Oral)  Resp 20  SpO2 98%  Patient/guardian has voiced understanding and agreed to follow-up with the PCP or specialist.         Dierdre ForthHannah Dana Dorner, PA-C 03/14/14 [phone]

## 2014-04-15 ENCOUNTER — Emergency Department (HOSPITAL_COMMUNITY)
Admission: EM | Admit: 2014-04-15 | Discharge: 2014-04-15 | Disposition: A | Discharge status: Home / Self Care | Payer: Self-pay | Attending: Emergency Medicine | Admitting: Emergency Medicine

## 2014-04-15 ENCOUNTER — Encounter (HOSPITAL_COMMUNITY): Payer: Self-pay | Admitting: Emergency Medicine

## 2014-04-15 DIAGNOSIS — G43909 Migraine, unspecified, not intractable, without status migrainosus: Secondary | ICD-10-CM | POA: Insufficient documentation

## 2014-04-15 DIAGNOSIS — R1909 Other intra-abdominal and pelvic swelling, mass and lump: Secondary | ICD-10-CM | POA: Insufficient documentation

## 2014-04-15 DIAGNOSIS — N760 Acute vaginitis: Secondary | ICD-10-CM | POA: Insufficient documentation

## 2014-04-15 DIAGNOSIS — A499 Bacterial infection, unspecified: Secondary | ICD-10-CM | POA: Insufficient documentation

## 2014-04-15 DIAGNOSIS — Z3202 Encounter for pregnancy test, result negative: Secondary | ICD-10-CM | POA: Insufficient documentation

## 2014-04-15 DIAGNOSIS — B9689 Other specified bacterial agents as the cause of diseases classified elsewhere: Secondary | ICD-10-CM | POA: Insufficient documentation

## 2014-04-15 DIAGNOSIS — F17200 Nicotine dependence, unspecified, uncomplicated: Secondary | ICD-10-CM | POA: Insufficient documentation

## 2014-04-15 LAB — URINALYSIS, ROUTINE W REFLEX MICROSCOPIC
Bilirubin Urine: NEGATIVE
Glucose, UA: NEGATIVE mg/dL
Hgb urine dipstick: NEGATIVE
Ketones, ur: NEGATIVE mg/dL
Leukocytes, UA: NEGATIVE
Nitrite: NEGATIVE
Protein, ur: NEGATIVE mg/dL
Specific Gravity, Urine: 1.014 (ref 1.005–1.030)
Urobilinogen, UA: 0.2 mg/dL (ref 0.0–1.0)
pH: 5.5 (ref 5.0–8.0)

## 2014-04-15 LAB — WET PREP, GENITAL
Trich, Wet Prep: NONE SEEN
Yeast Wet Prep HPF POC: NONE SEEN

## 2014-04-15 LAB — POC URINE PREG, ED: Preg Test, Ur: NEGATIVE

## 2014-04-15 MED ORDER — METRONIDAZOLE 500 MG PO TABS: 500.0000 mg | ORAL_TABLET | Freq: Two times a day (BID) | ORAL | Status: DC

## 2014-04-15 NOTE — ED Provider Notes (Signed)
CSN: [id number]     Arrival date & time 04/15/14  1222 History   First MD Initiated Contact with Patient 04/15/14 1501     Chief Complaint  Patient presents with  . abd knot   . vaginal irritation      (Consider location/radiation/quality/duration/timing/severity/associated sxs/prior Treatment) HPI  32yF with abdominal pain. Onset 2 days ago, but currently resolved. Felt small knot around umbilicus. No diagnosed hx of hernia. Additional vaginal "irritation." atributes to being in hot tub while at the beach recently. No pain. No fever or chills. No discharge. No urinary complaints.   Past Medical History  Diagnosis Date  . Migraine    History reviewed. No pertinent past surgical history. Family History  Problem Relation Age of Onset  . Hypertension Other   . Diabetes Other   . Cancer Other    History  Substance Use Topics  . Smoking status: Current Every Day Smoker    Types: Cigarettes  . Smokeless tobacco: Not on file  . Alcohol Use: No   OB History   Grav Para Term Preterm Abortions TAB SAB Ect Mult Living                 Review of Systems  All systems reviewed and negative, other than as noted in HPI.   Allergies  Review of patient's allergies indicates no known allergies.  Home Medications   Prior to Admission medications   Medication Sig Start Date End Date Taking? Authorizing Provider  SUMAtriptan (IMITREX) 100 MG tablet Take 100 mg by mouth every 2 (two) hours as needed for migraine or headache. May repeat in 2 hours if headache persists or recurs.   Yes Historical Provider, MD   BP 123/73  Pulse 62  Resp 18  SpO2 99% Physical Exam  Nursing note and vitals reviewed. Constitutional: She appears well-developed and well-nourished. No distress.  HENT:  Head: Normocephalic and atraumatic.  Eyes: Conjunctivae are normal. Right eye exhibits no discharge. Left eye exhibits no discharge.  Neck: Neck supple.  Cardiovascular: Normal rate, regular rhythm and  normal heart sounds.  Exam reveals no gallop and no friction rub.   No murmur heard. Pulmonary/Chest: Effort normal and breath sounds normal. No respiratory distress.  Abdominal: Soft. She exhibits no distension. There is no tenderness.  Genitourinary:  Normal external female genitalia. Thick whitish discharge in vault.  Musculoskeletal: She exhibits no edema and no tenderness.  Neurological: She is alert.  Skin: Skin is warm and dry.  Psychiatric: She has a normal mood and affect. Her behavior is normal. Thought content normal.    ED Course  Procedures (including critical care time) Labs Review Labs Reviewed  WET PREP, GENITAL - Abnormal; Notable for the following:    Clue Cells Wet Prep HPF POC MANY (*)    WBC, Wet Prep HPF POC MODERATE (*)    All other components within normal limits  URINALYSIS, ROUTINE W REFLEX MICROSCOPIC - Abnormal; Notable for the following:    APPearance CLOUDY (*)    All other components within normal limits  GC/CHLAMYDIA PROBE AMP  RPR  POC URINE PREG, ED    Imaging Review No results found.   EKG Interpretation None      MDM   Final diagnoses:  Bacterial vaginosis    33 year old female with symptoms consistent with an umbilical hernia. I cannot appreciate one on my exam, but somewhat hampered by body habitus. Currently symptom free with regards to this. Routine outpatient surgical followup. Return precautions were  discussed. Wet prep consistent with BV. Flagyl. Outpt FU.     Raeford RazorStephen Royal Vandevoort, MD 04/17/14 1346

## 2014-04-15 NOTE — Discharge Instructions (Signed)

## 2014-04-15 NOTE — Progress Notes (Signed)
[facility] provided pt with a list of primary care resources and a [facility] [employer] Card application to help patient establish primary care.  °

## 2014-04-15 NOTE — ED Notes (Signed)
Pt c/o abd pain and knot in her abd right above her umbilicus x 2 days and vaginal irritation since Sunday. Pt states she went to the beach last weekend and was in the hot tub and doesn't know if got bacterial infection or infection from being in wet bathing suit for too long.  Pt also thinks that she has hernia bc she has same symptoms as her friend who just had surgery on hernia . Pt states that she read and she is obese, do heavy lifting and cough/strain a lot like the Internet said where causes.

## 2014-04-16 LAB — GC/CHLAMYDIA PROBE AMP
CT Probe RNA: NEGATIVE
GC Probe RNA: NEGATIVE

## 2014-04-16 LAB — RPR

## 2014-05-19 ENCOUNTER — Emergency Department (HOSPITAL_COMMUNITY)
Admission: EM | Admit: 2014-05-19 | Discharge: 2014-05-19 | Disposition: A | Discharge status: Home / Self Care | Payer: Self-pay | Attending: Emergency Medicine | Admitting: Emergency Medicine

## 2014-05-19 ENCOUNTER — Encounter (HOSPITAL_COMMUNITY): Payer: Self-pay | Admitting: Emergency Medicine

## 2014-05-19 DIAGNOSIS — G43909 Migraine, unspecified, not intractable, without status migrainosus: Secondary | ICD-10-CM | POA: Insufficient documentation

## 2014-05-19 DIAGNOSIS — F17200 Nicotine dependence, unspecified, uncomplicated: Secondary | ICD-10-CM | POA: Insufficient documentation

## 2014-05-19 DIAGNOSIS — A499 Bacterial infection, unspecified: Secondary | ICD-10-CM | POA: Insufficient documentation

## 2014-05-19 DIAGNOSIS — J029 Acute pharyngitis, unspecified: Secondary | ICD-10-CM | POA: Insufficient documentation

## 2014-05-19 DIAGNOSIS — N760 Acute vaginitis: Secondary | ICD-10-CM | POA: Insufficient documentation

## 2014-05-19 DIAGNOSIS — B9689 Other specified bacterial agents as the cause of diseases classified elsewhere: Secondary | ICD-10-CM | POA: Insufficient documentation

## 2014-05-19 DIAGNOSIS — Z792 Long term (current) use of antibiotics: Secondary | ICD-10-CM | POA: Insufficient documentation

## 2014-05-19 LAB — RAPID STREP SCREEN (MED CTR MEBANE ONLY): Streptococcus, Group A Screen (Direct): NEGATIVE

## 2014-05-19 LAB — WET PREP, GENITAL
Trich, Wet Prep: NONE SEEN
Yeast Wet Prep HPF POC: NONE SEEN

## 2014-05-19 MED ORDER — METRONIDAZOLE 500 MG PO TABS: 500.0000 mg | ORAL_TABLET | Freq: Two times a day (BID) | ORAL | Status: DC

## 2014-05-19 MED ORDER — IBUPROFEN 600 MG PO TABS: 600.0000 mg | ORAL_TABLET | Freq: Four times a day (QID) | ORAL | Status: DC | PRN

## 2014-05-19 NOTE — Discharge Instructions (Signed)
Ibuprofen for pain. Salt water gargles. Flagyl for bacterial vaginosis. Follow up with primary care doctor.    Viral Pharyngitis Viral pharyngitis is a viral infection that produces redness, pain, and swelling (inflammation) of the throat. It can spread from person to person (contagious). CAUSES Viral pharyngitis is caused by inhaling a large amount of certain germs called viruses. Many different viruses cause viral pharyngitis. SYMPTOMS Symptoms of viral pharyngitis include:  Sore throat.  Tiredness.  Stuffy nose.  Low-grade fever.  Congestion.  Cough. TREATMENT Treatment includes rest, drinking plenty of fluids, and the use of over-the-counter medication (approved by your caregiver). HOME CARE INSTRUCTIONS   Drink enough fluids to keep your urine clear or pale yellow.  Eat soft, cold foods such as ice cream, frozen ice pops, or gelatin dessert.  Gargle with warm salt water (1 tsp salt per 1 qt of water).  If over age 297, throat lozenges may be used safely.  Only take over-the-counter or prescription medicines for pain, discomfort, or fever as directed by your caregiver. Do not take aspirin. To help prevent spreading viral pharyngitis to others, avoid:  Mouth-to-mouth contact with others.  Sharing utensils for eating and drinking.  Coughing around others. SEEK MEDICAL CARE IF:   You are better in a few days, then become worse.  You have a fever or pain not helped by pain medicines.  There are any other changes that concern you. Document Released: 09/14/2005 Document Revised: 02/27/2012 Document Reviewed: 02/10/2011 [facility] Patient Information 2014 [place], [place].  Bacterial Vaginosis Bacterial vaginosis is a vaginal infection that occurs when the normal balance of bacteria in the vagina is disrupted. It results from an overgrowth of certain bacteria. This is the most common vaginal infection in women of childbearing age. Treatment is important to prevent  complications, especially in pregnant women, as it can cause a premature delivery. CAUSES  Bacterial vaginosis is caused by an increase in harmful bacteria that are normally present in smaller amounts in the vagina. Several different kinds of bacteria can cause bacterial vaginosis. However, the reason that the condition develops is not fully understood. RISK FACTORS Certain activities or behaviors can put you at an increased risk of developing bacterial vaginosis, including:  Having a new sex partner or multiple sex partners.  Douching.  Using an intrauterine device (IUD) for contraception. Women do not get bacterial vaginosis from toilet seats, bedding, swimming pools, or contact with objects around them. SIGNS AND SYMPTOMS  Some women with bacterial vaginosis have no signs or symptoms. Common symptoms include:  Grey vaginal discharge.  A fishlike odor with discharge, especially after sexual intercourse.  Itching or burning of the vagina and vulva.  Burning or pain with urination. DIAGNOSIS  Your health care provider will take a medical history and examine the vagina for signs of bacterial vaginosis. A sample of vaginal fluid may be taken. Your health care provider will look at this sample under a microscope to check for bacteria and abnormal cells. A vaginal pH test may also be done.  TREATMENT  Bacterial vaginosis may be treated with antibiotic medicines. These may be given in the form of a pill or a vaginal cream. A second round of antibiotics may be prescribed if the condition comes back after treatment.  HOME CARE INSTRUCTIONS   Only take over-the-counter or prescription medicines as directed by your health care provider.  If antibiotic medicine was prescribed, take it as directed. Make sure you finish it even if you start to feel better.  Do not have sex until treatment is completed.  Tell all sexual partners that you have a vaginal infection. They should see their health  care provider and be treated if they have problems, such as a mild rash or itching.  Practice safe sex by using condoms and only having one sex partner. SEEK MEDICAL CARE IF:   Your symptoms are not improving after 3 days of treatment.  You have increased discharge or pain.  You have a fever. MAKE SURE YOU:   Understand these instructions.  Will watch your condition.  Will get help right away if you are not doing well or get worse. FOR MORE INFORMATION  Centers for Disease Control and Prevention, Division of STD Prevention: [employer]s.co.za American Sexual Health Association (ASHA): www.ashastd.org  Document Released: 12/05/2005 Document Revised: 09/25/2013 Document Reviewed: 07/17/2013 [facility] Patient Information 2014 [place], [place].

## 2014-05-19 NOTE — ED Provider Notes (Signed)
CSN: [id number]     Arrival date & time 05/19/14  1506 History   First MD Initiated Contact with Patient 05/19/14 1626     Chief Complaint  Patient presents with  . Sore Throat   Patient is a 33 y.o. female presenting with pharyngitis. The history is provided by the patient. No language interpreter was used.  Sore Throat   This chart was scribed for non-physician practitioner working with Raeford Razor, MD, by Andrew Au, ED Scribe. This patient was seen in room WTR6/WTR6 and the patient's care was started at 4:27 PM.  Aurie MADYSEN RADHAKRISHNAN is a 33 y.o. female who presents to the Emergency Department complaining of a sore throat onset 2 days. Pt reports throat has been irritated since swimming 2 days ago. States last night had trouble swallowing. Reports white pustules in throat but have subsided after eating hot food. Pt has taken muscle relaxer.  Pt is concerned she may have yeast infection. She believes she may have gotten an infection from the pool.  States every time she goes swimming she gets an infection. Pt denies dysuria.   Past Medical History  Diagnosis Date  . Migraine    History reviewed. No pertinent past surgical history. Family History  Problem Relation Age of Onset  . Hypertension Other   . Diabetes Other   . Cancer Other    History  Substance Use Topics  . Smoking status: Current Every Day Smoker    Types: Cigarettes  . Smokeless tobacco: Not on file  . Alcohol Use: No   OB History   Grav Para Term Preterm Abortions TAB SAB Ect Mult Living                 Review of Systems  HENT: Positive for sore throat and trouble swallowing. Negative for ear pain.   Genitourinary: Negative for dysuria.   Allergies  Review of patient's allergies indicates no known allergies.  Home Medications   Prior to Admission medications   Medication Sig Start Date End Date Taking? Authorizing Provider  metroNIDAZOLE (FLAGYL) 500 MG tablet Take 1 tablet (500 mg total) by mouth 2  (two) times daily. 04/15/14   Raeford Razor, MD  SUMAtriptan (IMITREX) 100 MG tablet Take 100 mg by mouth every 2 (two) hours as needed for migraine or headache. May repeat in 2 hours if headache persists or recurs.    Historical Provider, MD  Triage Vitals- BP 125/70  Pulse 72  Temp(Src) 98 F (36.7 C) (Oral)  Resp 16  SpO2 100% Physical Exam  Nursing note and vitals reviewed. Constitutional: She is oriented to person, place, and time. She appears well-developed and well-nourished. No distress.  HENT:  Head: Normocephalic and atraumatic.  Mouth/Throat: Uvula is midline. No oropharyngeal exudate.  Bilateral large tonsils with erythema, no exudate. Uvula midline.  Eyes: EOM are normal.  Neck: Neck supple.  Cardiovascular: Normal rate.   Pulmonary/Chest: Effort normal.  Genitourinary:  Normal external genitalia, minimal 7 white discharge, normal vaginal canal. No cervical motion tenderness. No uterine or axial tenderness  Musculoskeletal: Normal range of motion.  Neurological: She is alert and oriented to person, place, and time.  Skin: Skin is warm and dry.  Psychiatric: She has a normal mood and affect. Her behavior is normal.   ED Course  Procedures (including critical care time) Labs Review Labs Reviewed  WET PREP, GENITAL - Abnormal; Notable for the following:    Clue Cells Wet Prep HPF POC FEW (*)  WBC, Wet Prep HPF POC FEW (*)    All other components within normal limits  RAPID STREP SCREEN  CULTURE, GROUP A STREP  GC/CHLAMYDIA PROBE AMP    Imaging Review No results found.   EKG Interpretation None      MDM   Final diagnoses:  Pharyngitis  BV (bacterial vaginosis)   Patient with supper for 2 days, vaginal irritation. Pharynx does appear to be her pain medicine, however there is no uvular deviations, no evidence of peritonsillar abscess, no exudate. She's afebrile. Rapid strep was negative,  cultures sent. Most likely viral pharyngitis. Discussed symptomatic  treatment. Vaginal exam unremarkable, wet prep came back showing a few clue cells. Will cover with Flagyl. Advised to followup closely with her Dr.   Ceasar MonsFiled Vitals:   05/19/14 1545 05/19/14 1829  BP: 125/70 109/80  Pulse: 72 64  Temp: 98 F (36.7 C) 98.7 F (37.1 C)  TempSrc: Oral Oral  Resp: 16 18  SpO2: 100% 100%   I personally performed the services described in this documentation, which was scribed in my presence. The recorded information has been reviewed and is accurate.    Lottie Musselatyana A Callaway Hailes, PA-C 05/19/14 1944

## 2014-05-19 NOTE — ED Notes (Signed)
Pt states that she has had a sore throat x 2 days and notices white pustules on her tonsils. Also notes that she may have a yeast infection from swimming yesterday. Alert and oriented.

## 2014-05-20 LAB — GC/CHLAMYDIA PROBE AMP
CT PROBE, AMP APTIMA: NEGATIVE
GC Probe RNA: NEGATIVE

## 2014-05-21 LAB — CULTURE, GROUP A STREP

## 2014-05-22 NOTE — ED Provider Notes (Signed)
Medical screening examination/treatment/procedure(s) were performed by non-physician practitioner and as supervising physician I was immediately available for consultation/collaboration.   EKG Interpretation None       Samyukta Cura, MD 05/22/14 0305 

## 2014-06-04 ENCOUNTER — Emergency Department (HOSPITAL_COMMUNITY): Payer: Self-pay

## 2014-06-04 ENCOUNTER — Encounter (HOSPITAL_COMMUNITY): Payer: Self-pay | Admitting: Emergency Medicine

## 2014-06-04 ENCOUNTER — Emergency Department (HOSPITAL_COMMUNITY)
Admission: EM | Admit: 2014-06-04 | Discharge: 2014-06-04 | Disposition: A | Discharge status: Home / Self Care | Payer: Self-pay | Attending: Emergency Medicine | Admitting: Emergency Medicine

## 2014-06-04 DIAGNOSIS — K8020 Calculus of gallbladder without cholecystitis without obstruction: Secondary | ICD-10-CM

## 2014-06-04 DIAGNOSIS — R21 Rash and other nonspecific skin eruption: Secondary | ICD-10-CM | POA: Insufficient documentation

## 2014-06-04 DIAGNOSIS — G43109 Migraine with aura, not intractable, without status migrainosus: Secondary | ICD-10-CM | POA: Insufficient documentation

## 2014-06-04 DIAGNOSIS — F17200 Nicotine dependence, unspecified, uncomplicated: Secondary | ICD-10-CM | POA: Insufficient documentation

## 2014-06-04 DIAGNOSIS — L0291 Cutaneous abscess, unspecified: Secondary | ICD-10-CM

## 2014-06-04 DIAGNOSIS — K8000 Calculus of gallbladder with acute cholecystitis without obstruction: Secondary | ICD-10-CM | POA: Insufficient documentation

## 2014-06-04 DIAGNOSIS — R112 Nausea with vomiting, unspecified: Secondary | ICD-10-CM | POA: Insufficient documentation

## 2014-06-04 DIAGNOSIS — Z79899 Other long term (current) drug therapy: Secondary | ICD-10-CM | POA: Insufficient documentation

## 2014-06-04 DIAGNOSIS — N750 Cyst of Bartholin's gland: Secondary | ICD-10-CM | POA: Insufficient documentation

## 2014-06-04 DIAGNOSIS — Z792 Long term (current) use of antibiotics: Secondary | ICD-10-CM | POA: Insufficient documentation

## 2014-06-04 LAB — COMPREHENSIVE METABOLIC PANEL
ALBUMIN: 4 g/dL (ref 3.5–5.2)
ALK PHOS: 84 U/L (ref 39–117)
ALT: 19 U/L (ref 0–35)
AST: 17 U/L (ref 0–37)
BUN: 11 mg/dL (ref 6–23)
CO2: 18 mEq/L — ABNORMAL LOW (ref 19–32)
Calcium: 9.8 mg/dL (ref 8.4–10.5)
Chloride: 97 mEq/L (ref 96–112)
Creatinine, Ser: 0.9 mg/dL (ref 0.50–1.10)
GFR calc Af Amer: >90 mL/min (ref >90)
GFR calc non Af Amer: 82 mL/min — ABNORMAL LOW (ref >90)
Glucose, Bld: 182 mg/dL — ABNORMAL HIGH (ref 70–99)
POTASSIUM: 3.6 meq/L — AB (ref 3.7–5.3)
SODIUM: 137 meq/L (ref 137–147)
TOTAL PROTEIN: 7.9 g/dL (ref 6.0–8.3)
Total Bilirubin: 0.5 mg/dL (ref 0.3–1.2)

## 2014-06-04 LAB — URINALYSIS, ROUTINE W REFLEX MICROSCOPIC
BILIRUBIN URINE: NEGATIVE
Glucose, UA: 250 mg/dL — AB
HGB URINE DIPSTICK: NEGATIVE
Ketones, ur: 40 mg/dL — AB
Nitrite: NEGATIVE
Protein, ur: 30 mg/dL — AB
SPECIFIC GRAVITY, URINE: 1.017 (ref 1.005–1.030)
Urobilinogen, UA: 0.2 mg/dL (ref 0.0–1.0)
pH: 6.5 (ref 5.0–8.0)

## 2014-06-04 LAB — CBC WITH DIFFERENTIAL/PLATELET
BASOS PCT: 0 % (ref 0–1)
Basophils Absolute: 0 10*3/uL (ref 0.0–0.1)
EOS ABS: 0.1 10*3/uL (ref 0.0–0.7)
Eosinophils Relative: 1 % (ref 0–5)
HCT: 37.4 % (ref 36.0–46.0)
Hemoglobin: 13 g/dL (ref 12.0–15.0)
Lymphocytes Relative: 27 % (ref 12–46)
Lymphs Abs: 2.8 10*3/uL (ref 0.7–4.0)
MCH: 28.1 pg (ref 26.0–34.0)
MCHC: 34.8 g/dL (ref 30.0–36.0)
MCV: 80.8 fL (ref 78.0–100.0)
Monocytes Absolute: 0.6 10*3/uL (ref 0.1–1.0)
Monocytes Relative: 6 % (ref 3–12)
NEUTROS PCT: 66 % (ref 43–77)
Neutro Abs: 6.9 10*3/uL (ref 1.7–7.7)
PLATELETS: 274 10*3/uL (ref 150–400)
RBC: 4.63 MIL/uL (ref 3.87–5.11)
RDW: 12.7 % (ref 11.5–15.5)
WBC: 10.4 10*3/uL (ref 4.0–10.5)

## 2014-06-04 LAB — LIPASE, BLOOD: LIPASE: 32 U/L (ref 11–59)

## 2014-06-04 LAB — URINE MICROSCOPIC-ADD ON

## 2014-06-04 MED ORDER — ONDANSETRON 4 MG PO TBDP
8.0000 mg | ORAL_TABLET | Freq: Once | ORAL | Status: AC
  Administered 2014-06-04: 8 mg via ORAL

## 2014-06-04 MED ORDER — HYDROCODONE-ACETAMINOPHEN 5-325 MG PO TABS: 1.0000 | ORAL_TABLET | Freq: Four times a day (QID) | ORAL | Status: DC | PRN

## 2014-06-04 MED ORDER — PROMETHAZINE HCL 25 MG PO TABS: 25.0000 mg | ORAL_TABLET | Freq: Four times a day (QID) | ORAL | Status: DC | PRN

## 2014-06-04 MED ORDER — SULFAMETHOXAZOLE-TRIMETHOPRIM 800-160 MG PO TABS: 1.0000 | ORAL_TABLET | Freq: Two times a day (BID) | ORAL | Status: DC

## 2014-06-04 MED ORDER — LACTATED RINGERS IV BOLUS (SEPSIS)
1000.0000 mL | Freq: Once | INTRAVENOUS | Status: AC
  Administered 2014-06-04: 1000 mL via INTRAVENOUS

## 2014-06-04 MED ORDER — ONDANSETRON 4 MG PO TBDP
ORAL_TABLET | ORAL | Status: AC
  Filled 2014-06-04: qty 2

## 2014-06-04 MED ORDER — GI COCKTAIL ~~LOC~~
30.0000 mL | Freq: Once | ORAL | Status: AC
  Administered 2014-06-04: 30 mL via ORAL
  Filled 2014-06-04: qty 30

## 2014-06-04 MED ORDER — MORPHINE SULFATE 4 MG/ML IJ SOLN
4.0000 mg | Freq: Once | INTRAMUSCULAR | Status: AC
  Administered 2014-06-04: 4 mg via INTRAVENOUS
  Filled 2014-06-04: qty 1

## 2014-06-04 MED ORDER — CEPHALEXIN 500 MG PO CAPS: 500.0000 mg | ORAL_CAPSULE | Freq: Four times a day (QID) | ORAL | Status: DC

## 2014-06-04 MED ORDER — ONDANSETRON HCL 4 MG/2ML IJ SOLN
4.0000 mg | Freq: Once | INTRAMUSCULAR | Status: AC
  Administered 2014-06-04: 4 mg via INTRAVENOUS
  Filled 2014-06-04: qty 2

## 2014-06-04 NOTE — ED Provider Notes (Signed)
This provider was asked by attending physician, Dr. Theodoro GristA. Nanavati to perform I&D of abscess - this provider was happy to perform the procedure.   INCISION AND DRAINAGE Performed by: Beverly [name], Beverly Consent: Verbal consent obtained. Risks and benefits: risks, benefits and alternatives were discussed Type: abscess Body area: bartholin cyst Anesthesia: local infiltration Incision was made with a scalpel. Local anesthetic: lidocaine 2% without epinephrine Anesthetic total: 2 ml Complexity: complex Blunt dissection to break up loculations Drainage: purulent Drainage amount: 5 cc Packing material: 1/4 in iodoform gauze Patient tolerance: Patient tolerated the procedure well with no immediate complications.  Discussed with patient to keep packing in for 2 days. Discussed with patient to apply warm compressions to the site and to massage. Discussed with patient to closely monitor symptoms and if symptoms are to worsen or change to report back to the ED - strict return instructions given. Patient agreed to plan of care, understood, all questions answered.     Beverly MuttonMarissa Sciacca, PA-C 06/04/14 1759  Beverly Sciacca, PA-C 06/04/14 1800

## 2014-06-04 NOTE — ED Provider Notes (Addendum)
Patient seen by me primarily, Marissa Sciacca PA-C completed the i&d procedure for me. Patient tolerated the procedure well, and was discharged.    Derwood KaplanAnkit Nanavati, MD 06/04/14 [id number]  Derwood KaplanAnkit Nanavati, MD 06/17/14 [id number]

## 2014-06-04 NOTE — Discharge Instructions (Signed)
See Surgery team as requested. Return to the ER to get the packing removed in 2 days or go to an urgent care.  Please return to the ER if your symptoms worsen; you have increased pain, fevers, chills, inability to keep any medications down, confusion. Otherwise see the outpatient doctor as requested.   Abscess An abscess is an infected area that contains a collection of pus and debris.It can occur in almost any part of the body. An abscess is also known as a furuncle or boil. CAUSES  An abscess occurs when tissue gets infected. This can occur from blockage of oil or sweat glands, infection of hair follicles, or a minor injury to the skin. As the body tries to fight the infection, pus collects in the area and creates pressure under the skin. This pressure causes pain. People with weakened immune systems have difficulty fighting infections and get certain abscesses more often.  SYMPTOMS Usually an abscess develops on the skin and becomes a painful mass that is red, warm, and tender. If the abscess forms under the skin, you may feel a moveable soft area under the skin. Some abscesses break open (rupture) on their own, but most will continue to get worse without care. The infection can spread deeper into the body and eventually into the bloodstream, causing you to feel ill.  DIAGNOSIS  Your caregiver will take your medical history and perform a physical exam. A sample of fluid may also be taken from the abscess to determine what is causing your infection. TREATMENT  Your caregiver may prescribe antibiotic medicines to fight the infection. However, taking antibiotics alone usually does not cure an abscess. Your caregiver may need to make a small cut (incision) in the abscess to drain the pus. In some cases, gauze is packed into the abscess to reduce pain and to continue draining the area. HOME CARE INSTRUCTIONS   Only take over-the-counter or prescription medicines for pain, discomfort, or fever as  directed by your caregiver.  If you were prescribed antibiotics, take them as directed. Finish them even if you start to feel better.  If gauze is used, follow your caregiver's directions for changing the gauze.  To avoid spreading the infection:  Keep your draining abscess covered with a bandage.  Wash your hands well.  Do not share personal care items, towels, or whirlpools with others.  Avoid skin contact with others.  Keep your skin and clothes clean around the abscess.  Keep all follow-up appointments as directed by your caregiver. SEEK MEDICAL CARE IF:   You have increased pain, swelling, redness, fluid drainage, or bleeding.  You have muscle aches, chills, or a general ill feeling.  You have a fever. MAKE SURE YOU:   Understand these instructions.  Will watch your condition.  Will get help right away if you are not doing well or get worse. Document Released: 09/14/2005 Document Revised: 06/05/2012 Document Reviewed: 02/17/2012 [facility] Patient Information 2015 [place], [place]. This information is not intended to replace advice given to you by your health care provider. Make sure you discuss any questions you have with your health care provider. Cholelithiasis Cholelithiasis (also called gallstones) is a form of gallbladder disease in which gallstones form in your gallbladder. The gallbladder is an organ that stores bile made in the liver, which helps digest fats. Gallstones begin as small crystals and slowly grow into stones. Gallstone pain occurs when the gallbladder spasms and a gallstone is blocking the duct. Pain can also occur when a stone  passes out of the duct.  RISK FACTORS  Being female.   Having multiple pregnancies. Health care providers sometimes advise removing diseased gallbladders before future pregnancies.   Being obese.  Eating a diet heavy in fried foods and fat.   Being older than 60 years and increasing age.   Prolonged use of medicines  containing female hormones.   Having diabetes mellitus.   Rapidly losing weight.   Having a family history of gallstones (heredity).  SYMPTOMS  Nausea.   Vomiting.  Abdominal pain.   Yellowing of the skin (jaundice).   Sudden pain. It may persist from several minutes to several hours.  Fever.   Tenderness to the touch. In some cases, when gallstones do not move into the bile duct, people have no pain or symptoms. These are called "silent" gallstones.  TREATMENT Silent gallstones do not need treatment. In severe cases, emergency surgery may be required. Options for treatment include:  Surgery to remove the gallbladder. This is the most common treatment.  Medicines. These do not always work and may take 6-12 months or more to work.  Shock wave treatment (extracorporeal biliary lithotripsy). In this treatment an ultrasound machine sends shock waves to the gallbladder to break gallstones into smaller pieces that can pass into the intestines or be dissolved by medicine. HOME CARE INSTRUCTIONS   Only take over-the-counter or prescription medicines for pain, discomfort, or fever as directed by your health care provider.   Follow a low-fat diet until seen again by your health care provider. Fat causes the gallbladder to contract, which can result in pain.   Follow up with your health care provider as directed. Attacks are almost always recurrent and surgery is usually required for permanent treatment.  SEEK IMMEDIATE MEDICAL CARE IF:   Your pain increases and is not controlled by medicines.   You have a fever or persistent symptoms for more than 2-3 days.   You have a fever and your symptoms suddenly get worse.   You have persistent nausea and vomiting.  MAKE SURE YOU:   Understand these instructions.  Will watch your condition.  Will get help right away if you are not doing well or get worse. Document Released: 12/01/2005 Document Revised: 08/07/2013  Document Reviewed: 05/29/2013 [facility] Patient Information 2015 [place], [place]. This information is not intended to replace advice given to you by your health care provider. Make sure you discuss any questions you have with your health care provider.

## 2014-06-04 NOTE — ED Notes (Signed)
Patient transported to Ultrasound 

## 2014-06-04 NOTE — ED Notes (Signed)
PA at bedside.

## 2014-06-04 NOTE — ED Notes (Signed)
Pt actively vomiting in triage 

## 2014-06-04 NOTE — ED Notes (Signed)
Patient presents stating that she has been throwing up for about 4 hours.  Also has a "boil" on her buttock

## 2014-06-17 NOTE — ED Provider Notes (Signed)
CSN: [id number]     Arrival date & time 06/04/14  0053 History   First MD Initiated Contact with Patient 06/04/14 [phone]     Chief Complaint  Patient presents with  . Abdominal Pain  . Emesis     (Consider location/radiation/quality/duration/timing/severity/associated sxs/prior Treatment) HPI Comments: Pt comes in with cc of abd pain and emesis. She also has an abscess on her buttock. Pt has no significant mefial hx. Has been having off and on abd pain - mostly after food intake. Today, she has been having associated emesis. Pain and emesis started after she had some food. Pain is right sided and epigastric. No fevers, chills.  Patient is a 33 y.o. female presenting with abdominal pain and vomiting. The history is provided by the patient.  Abdominal Pain Associated symptoms: nausea and vomiting   Associated symptoms: no chest pain, no constipation, no cough, no diarrhea, no dysuria, no hematuria and no shortness of breath   Emesis Associated symptoms: abdominal pain   Associated symptoms: no diarrhea and no headaches     Past Medical History  Diagnosis Date  . Migraine    History reviewed. No pertinent past surgical history. Family History  Problem Relation Age of Onset  . Hypertension Other   . Diabetes Other   . Cancer Other    History  Substance Use Topics  . Smoking status: Current Every Day Smoker    Types: Cigarettes  . Smokeless tobacco: Not on file  . Alcohol Use: No   OB History   Grav Para Term Preterm Abortions TAB SAB Ect Mult Living                 Review of Systems  Constitutional: Negative for activity change.  HENT: Negative for facial swelling.   Respiratory: Negative for cough, shortness of breath and wheezing.   Cardiovascular: Negative for chest pain.  Gastrointestinal: Positive for nausea, vomiting and abdominal pain. Negative for diarrhea, constipation, blood in stool and abdominal distention.  Genitourinary: Negative for dysuria, hematuria and  difficulty urinating.  Musculoskeletal: Negative for neck pain.  Skin: Positive for rash. Negative for color change.  Neurological: Negative for speech difficulty and headaches.  Hematological: Does not bruise/bleed easily.  Psychiatric/Behavioral: Negative for confusion.      Allergies  Review of patient's allergies indicates no known allergies.  Home Medications   Prior to Admission medications   Medication Sig Start Date End Date Taking? Authorizing Provider  ibuprofen (ADVIL,MOTRIN) 600 MG tablet Take 600 mg by mouth every 6 (six) hours as needed for moderate pain.   Yes Historical Provider, MD  SUMAtriptan (IMITREX) 100 MG tablet Take 100 mg by mouth every 2 (two) hours as needed for migraine or headache. May repeat in 2 hours if headache persists or recurs.   Yes Historical Provider, MD  cephALEXin (KEFLEX) 500 MG capsule Take 1 capsule (500 mg total) by mouth 4 (four) times daily. 06/04/14   Derwood KaplanAnkit Oneka Parada, MD  HYDROcodone-acetaminophen (NORCO/VICODIN) 5-325 MG per tablet Take 1 tablet by mouth every 6 (six) hours as needed. 06/04/14   Derwood KaplanAnkit Bardia Wangerin, MD  promethazine (PHENERGAN) 25 MG tablet Take 1 tablet (25 mg total) by mouth every 6 (six) hours as needed for nausea. 06/04/14   Derwood KaplanAnkit Levorn Oleski, MD  sulfamethoxazole-trimethoprim (SEPTRA DS) 800-160 MG per tablet Take 1 tablet by mouth every 12 (twelve) hours. 06/04/14   Barri Neidlinger, MD   BP 107/54  Pulse 70  Temp(Src) 97.6 F (36.4 C) (Oral)  Resp 14  Ht 5\' 9"  (1.753 m)  Wt 280 lb (127.007 kg)  BMI 41.33 kg/m2  SpO2 100% Physical Exam  Nursing note and vitals reviewed. Constitutional: She is oriented to person, place, and time. She appears well-developed and well-nourished.  HENT:  Head: Normocephalic and atraumatic.  Eyes: EOM are normal. Pupils are equal, round, and reactive to light.  Neck: Neck supple.  Cardiovascular: Normal rate, regular rhythm and normal heart sounds.   No murmur heard. Pulmonary/Chest: Effort  normal. No respiratory distress.  Abdominal: Soft. She exhibits no distension. There is tenderness. There is no rebound and no guarding.  Neurological: She is alert and oriented to person, place, and time.  Skin: Skin is warm and dry.  nodule with induration over the buttock. Tender to palpation.    ED Course  Procedures (including critical care time) Labs Review Labs Reviewed  COMPREHENSIVE METABOLIC PANEL - Abnormal; Notable for the following:    Potassium 3.6 (*)    CO2 18 (*)    Glucose, Bld 182 (*)    GFR calc non Af Amer 82 (*)    All other components within normal limits  URINALYSIS, ROUTINE W REFLEX MICROSCOPIC - Abnormal; Notable for the following:    APPearance CLOUDY (*)    Glucose, UA 250 (*)    Ketones, ur 40 (*)    Protein, ur 30 (*)    Leukocytes, UA SMALL (*)    All other components within normal limits  URINE MICROSCOPIC-ADD ON - Abnormal; Notable for the following:    Squamous Epithelial / LPF MANY (*)    Bacteria, UA FEW (*)    All other components within normal limits  CBC WITH DIFFERENTIAL  LIPASE, BLOOD    Imaging Review No results found.   EKG Interpretation None      MDM   Final diagnoses:  Abscess  Cholelithiasis    Pt with abd pain. Sx concerning for cholelithiasis - and US confirmed gall stones. No acute abd - surgery f/u provided and return precautions discussed. Also has a boil that was drained by PA-C. Antibiotics given as there is surrounding erythema.    Derwood KaplanAnkit Eulalia Ellerman, MD 06/17/14 [phone]

## 2015-02-12 ENCOUNTER — Emergency Department (HOSPITAL_COMMUNITY)
Admission: EM | Admit: 2015-02-12 | Discharge: 2015-02-12 | Disposition: A | Discharge status: Home / Self Care | Payer: Self-pay | Attending: Emergency Medicine | Admitting: Emergency Medicine

## 2015-02-12 ENCOUNTER — Encounter (HOSPITAL_COMMUNITY): Payer: Self-pay | Admitting: Emergency Medicine

## 2015-02-12 DIAGNOSIS — Z720 Tobacco use: Secondary | ICD-10-CM | POA: Insufficient documentation

## 2015-02-12 DIAGNOSIS — G43009 Migraine without aura, not intractable, without status migrainosus: Secondary | ICD-10-CM | POA: Insufficient documentation

## 2015-02-12 MED ORDER — DIPHENHYDRAMINE HCL 50 MG/ML IJ SOLN
25.0000 mg | Freq: Once | INTRAMUSCULAR | Status: AC
  Administered 2015-02-12: 25 mg via INTRAVENOUS
  Filled 2015-02-12: qty 1

## 2015-02-12 MED ORDER — KETOROLAC TROMETHAMINE 30 MG/ML IJ SOLN
30.0000 mg | Freq: Once | INTRAMUSCULAR | Status: AC
  Administered 2015-02-12: 30 mg via INTRAVENOUS
  Filled 2015-02-12: qty 1

## 2015-02-12 MED ORDER — METOCLOPRAMIDE HCL 5 MG/ML IJ SOLN
10.0000 mg | Freq: Once | INTRAMUSCULAR | Status: AC
  Administered 2015-02-12: 10 mg via INTRAVENOUS
  Filled 2015-02-12: qty 2

## 2015-02-12 MED ORDER — SODIUM CHLORIDE 0.9 % IV BOLUS (SEPSIS)
1000.0000 mL | Freq: Once | INTRAVENOUS | Status: AC
  Administered 2015-02-12: 1000 mL via INTRAVENOUS

## 2015-02-12 NOTE — ED Notes (Signed)
Pt is stable upon d/c and ambulates from ED escorted by staff. 

## 2015-02-12 NOTE — ED Notes (Addendum)
Pt presents to Ed with complaint of migraine. Unable to get relief. Has tried taking Imitrex. Hx of same. This migraine per pt began 2 days ago. Reports n/v and blurred vision. A/o x4. No neuro deficits noted.

## 2015-02-12 NOTE — ED Provider Notes (Signed)
CSN: [id number]     Arrival date & time 02/12/15  4098 History   First MD Initiated Contact with Patient 02/12/15 [phone]     Chief Complaint  Patient presents with  . Migraine   Patient is a 34 y.o. female presenting with migraines. The history is provided by the patient. No language interpreter was used.  Migraine This is a new problem. The current episode started in the past 7 days (2 days). The problem occurs constantly. The problem has been gradually worsening. Associated symptoms include fatigue, headaches, nausea and vomiting. Pertinent negatives include no abdominal pain, anorexia, arthralgias, change in bowel habit, chest pain, chills, congestion, coughing, diaphoresis, fever, joint swelling, myalgias, neck pain, numbness, rash, sore throat, swollen glands, urinary symptoms, vertigo, visual change or weakness. Exacerbated by: lights, sounds, and smells. Treatments tried: Excedrin, imitrex. The treatment provided no relief.   Headache came on gradually. Patient states that this headache is similar to her migraines that she's had in the past. She states that when the dispatch usually needs IV medicines to get rid of her headaches. Patient not followed by a neurologist.  Past Medical History  Diagnosis Date  . Migraine    History reviewed. No pertinent past surgical history. Family History  Problem Relation Age of Onset  . Hypertension Other   . Diabetes Other   . Cancer Other    History  Substance Use Topics  . Smoking status: Current Every Day Smoker    Types: Cigarettes  . Smokeless tobacco: Not on file  . Alcohol Use: No   OB History    No data available     Review of Systems  Constitutional: Positive for fatigue. Negative for fever, chills and diaphoresis.  HENT: Negative for congestion and sore throat.   Respiratory: Negative for cough.   Cardiovascular: Negative for chest pain.  Gastrointestinal: Positive for nausea and vomiting. Negative for abdominal pain, anorexia  and change in bowel habit.  Musculoskeletal: Negative for myalgias, joint swelling, arthralgias and neck pain.  Skin: Negative for rash.  Neurological: Positive for headaches. Negative for vertigo, weakness and numbness.  All other systems reviewed and are negative.     Allergies  Review of patient's allergies indicates no known allergies.  Home Medications   Prior to Admission medications   Medication Sig Start Date End Date Taking? Authorizing Provider  Aspirin-Acetaminophen-Caffeine (HEADACHE RELIEF PO) Take 2 tablets by mouth every 4 (four) hours as needed (headache).   Yes Historical Provider, MD  cephALEXin (KEFLEX) 500 MG capsule Take 1 capsule (500 mg total) by mouth 4 (four) times daily. Patient not taking: Reported on 02/12/2015 06/04/14   Derwood Kaplan, MD  HYDROcodone-acetaminophen (NORCO/VICODIN) 5-325 MG per tablet Take 1 tablet by mouth every 6 (six) hours as needed. Patient not taking: Reported on 02/12/2015 06/04/14   Derwood Kaplan, MD  ibuprofen (ADVIL,MOTRIN) 600 MG tablet Take 600 mg by mouth every 6 (six) hours as needed for moderate pain.    Historical Provider, MD  promethazine (PHENERGAN) 25 MG tablet Take 1 tablet (25 mg total) by mouth every 6 (six) hours as needed for nausea. Patient not taking: Reported on 02/12/2015 06/04/14   Derwood Kaplan, MD  sulfamethoxazole-trimethoprim (SEPTRA DS) 800-160 MG per tablet Take 1 tablet by mouth every 12 (twelve) hours. Patient not taking: Reported on 02/12/2015 06/04/14   Derwood Kaplan, MD  SUMAtriptan (IMITREX) 100 MG tablet Take 100 mg by mouth every 2 (two) hours as needed for migraine or headache. May repeat in 2  hours if headache persists or recurs.    Historical Provider, MD   BP 134/82 mmHg  Pulse 64  Temp(Src) 97.6 F (36.4 C) (Oral)  Resp 20  SpO2 100% Physical Exam  Constitutional: She is oriented to person, place, and time. She appears well-developed and well-nourished. No distress.  HENT:  Head:  Normocephalic and atraumatic.  Mouth/Throat: Oropharynx is clear and moist. No oropharyngeal exudate.  Eyes: Conjunctivae and EOM are normal. Pupils are equal, round, and reactive to light. No scleral icterus.  Neck: Normal range of motion. Neck supple. No JVD present. No Brudzinski's sign and no Kernig's sign noted. No thyromegaly present.  Cardiovascular: Normal rate, regular rhythm, normal heart sounds and intact distal pulses.  Exam reveals no gallop and no friction rub.   No murmur heard. Pulmonary/Chest: Effort normal and breath sounds normal. No respiratory distress. She has no wheezes. She has no rales. She exhibits no tenderness.  Abdominal: Soft. Bowel sounds are normal. She exhibits no distension and no mass. There is no tenderness. There is no rebound and no guarding.  Musculoskeletal: Normal range of motion.  Lymphadenopathy:    She has no cervical adenopathy.  Neurological: She is alert and oriented to person, place, and time. She has normal strength. No cranial nerve deficit or sensory deficit. Coordination normal.  No ataxia. Normal rapid alternating movements. Normal finger-nose.  Skin: Skin is warm and dry. She is not diaphoretic.  Nursing note and vitals reviewed.   ED Course  Procedures (including critical care time) Labs Review Labs Reviewed - No data to display  Imaging Review No results found.   EKG Interpretation None      MDM   Final diagnoses:  Migraine without aura and without status migrainosus, not intractable   Patient is a 34 year old female who presents emergency room for evaluation of migraine headache. Physical exam reveals no focal neurological deficits. There are no signs of meningismus. Patient is afebrile. There are no red flags for headaches. Patient given migraine cocktail including Toradol, Reglan, and Benadryl with complete relief of her headache. Patient is currently pain-free. We'll discharge the patient home with referral to neurology. I  have recommended that she keep a migraine diary to see if there are any triggers for her headaches. Patient seen by and discussed with Dr. Adriana Simasook who agrees with the above workup and plan. Patient return for sudden onset of the worst headache of her life, intractable nausea and vomiting, headaches associated with fever and neck stiffness, or any other concerning symptoms. She states understanding and agreement at this time.    Eben Burowourtney A Forcucci, PA-C 02/12/15 1040  Donnetta HutchingBrian Cook, MD 02/12/15 1328

## 2015-02-12 NOTE — Discharge Instructions (Signed)
Recurrent Migraine Headache A migraine headache is very bad, throbbing pain on one or both sides of your head. Recurrent migraines keep coming back. Talk to your doctor about what things may bring on (trigger) your migraine headaches. HOME CARE  Only take medicines as told by your doctor.  Lie down in a dark, quiet room when you have a migraine.  Keep a journal to find out if certain things bring on migraine headaches. For example, write down:  What you eat and drink.  How much sleep you get.  Any change to your diet or medicines.  Lessen how much alcohol you drink.  Quit smoking if you smoke.  Get enough sleep.  Lessen any stress in your life.  Keep lights dim if bright lights bother you or make your migraines worse. GET HELP IF:  Medicine does not help your migraines.  Your pain keeps coming back.  You have a fever. GET HELP RIGHT AWAY IF:   Your migraine becomes really bad.  You have a stiff neck.  You have trouble seeing.  Your muscles are weak, or you lose muscle control.  You lose your balance or have trouble walking.  You feel like you will pass out (faint), or you pass out.  You have really bad symptoms that are different than your first symptoms. MAKE SURE YOU:   Understand these instructions.  Will watch your condition.  Will get help right away if you are not doing well or get worse. Document Released: 09/13/2008 Document Revised: 12/10/2013 Document Reviewed: 08/12/2013 [facility] Patient Information 2015 [place], [place]. This information is not intended to replace advice given to you by your health care provider. Make sure you discuss any questions you have with your health care provider.   Emergency Department Resource Guide 1) Find a Doctor and Pay Out of Pocket Although you won't have to find out who is covered by your insurance plan, it is a good idea to ask around and get recommendations. You will then need to call the office and see if the  doctor you have chosen will accept you as a new patient and what types of options they offer for patients who are self-pay. Some doctors offer discounts or will set up payment plans for their patients who do not have insurance, but you will need to ask so you aren't surprised when you get to your appointment.  2) Contact Your Local Health Department Not all health departments have doctors that can see patients for sick visits, but many do, so it is worth a call to see if yours does. If you don't know where your local health department is, you can check in your phone book. The CDC also has a tool to help you locate your state's health department, and many state websites also have listings of all of their local health departments.  3) Find a Walk-in Clinic If your illness is not likely to be very severe or complicated, you may want to try a walk in clinic. These are popping up all over the country in pharmacies, drugstores, and shopping centers. They're usually staffed by nurse practitioners or physician assistants that have been trained to treat common illnesses and complaints. They're usually fairly quick and inexpensive. However, if you have serious medical issues or chronic medical problems, these are probably not your best option.  No Primary Care Doctor: - Call Health Connect at  [phone] - they can help you locate a primary care doctor that  accepts your insurance, provides  certain services, etc. - Physician Referral Service- [phone]  Chronic Pain Problems: Organization         Address  Phone   Notes  [facility]  [phone] Patients need to be referred by their primary care doctor.   Medication Assistance: Organization         Address  Phone   Notes  [facility]ication [facility] [address] [place], [place] [address] [phone] --Must be a resident of [facility] -- Must have NO insurance coverage whatsoever (no Medicaid/  Medicare, etc.) -- The pt. MUST have a primary care doctor that directs their care regularly and follows them in the community   MedAssist  [phone]   [employer]  [phone]    Agencies that provide inexpensive medical care: Organization         Address  Phone   Notes  [facility]  [phone]) [phone]   [facility]    [phone]   [facility] [address] [place], [place] [address] [phone]   [facility] [place] [address], [place] [phone]   Planned Parenthood    [phone]   [facility]    [phone]   [facility]  [address], [place] Phone:  [phone], Fax:  [phone] Hours of Operation:  9 am - 6 pm, M-F.  Also accepts Medicaid/Medicare and self-pay.  [facility]  [address], [place] Phone: [phone], Fax: [phone]. Hours of Operation:  8:30 am - 5:30 pm, M-F.  Also accepts Medicaid and self-pay.  [facility] [place] [address], [place] Phone: [phone]) [phone]   [facility] [address], [place], [place] [phone], Ext. [phone] Mondays & Thursdays: 7-9 AM.  First 15 patients are seen on a first come, first serve basis.    Medicaid-accepting [facility] Providers:  Organization         Address  Phone   Notes  [facility] [address], [place] [phone] Also accepts self-pay patients.  [facility] [address], [place]  [phone]) [phone]   [facility] [address], [place] [phone]   [facility] [address] [place] [address], [place] [phone]) [phone]   Renaye RakersVeita Bland [address], [place]   [phone] Only accepts [place] Access [place] patients after they have their name applied to their card.    Self-Pay (no insurance) in [facility]:  Organization         Address  Phone   Notes  Sickle Cell Patients, [facility] [address], [place] [phone]   [facility] [address], [place] [phone]   [facility] [place]  [address], [place] [phone]   [facility]/Dr. Osei-Bonsu  [address] [place] [address], [place] or [phone], [place] [phone] Phone number for both [place] and [place] locations is the same.  [facility] [address], [place] [phone]) [phone]   [facility] [address] [place] [address], [place] or [address] [phone]) [phone]   [facility] [address] [place] [phone], phone; [phone], fax Sees patients 1st and 3rd Saturday of every month.  Must not qualify for public or private insurance (i.e. Medicaid, Medicare, [place] Health Choice, Veterans' Benefits)  Household income should be no more than 200% of the poverty level The clinic cannot treat you if you are pregnant or think you are pregnant  Sexually transmitted diseases are not treated at the clinic.    Dental Care: Organization         Address  Phone  Notes  [facility] Department of [facility] [address] [place], [place] [phone] Accepts children up to age 36 who are enrolled in [place] or [place] Health Choice; pregnant women with a Medicaid card; and children who have applied for Medicaid or [place] Health Choice, but were declined, whose parents can pay a reduced fee at time of service.  [facility] [place] [facility]  [address], [place] [phone] Accepts children up to age 81 who are enrolled in [place] or [place] Health Choice; pregnant women with a Medicaid card; and children who have applied for Medicaid or  Health Choice,  but were declined, whose parents can pay a reduced fee at time of service.  [facility]  [address] [place], [place] [phone] Patients are seen by appointment only. Walk-ins are not accepted. [facility] will see patients 54 years of age and older. Monday - Tuesday (8am-5pm) Most Wednesdays (8:30-5pm) $30 per visit, cash only  [facility] Access PRO[facility]  [address], [facility] [phone] Patients are seen by appointment only. Walk-ins are not accepted. [facility] will see patients 39 years of age and older. One Wednesday Evening (Monthly: Volunteer Based).  $30 per visit, cash only  [employer]  [phone] for adults; Children under age 75, call Graduate Pediatric Dentistry at [phone]. Children aged 77-14, please call [phone] to request a pediatric application.  Dental services are provided in all areas of dental care including fillings, crowns and bridges, complete and partial dentures, implants, gum treatment, root canals, and extractions. Preventive care is also provided. Treatment is provided to both adults and children. Patients are selected via a lottery and there is often a waiting list.   [facility] [address], [place]  [phone] [email or web address]   [facility] [address] [place], [place] [phone], Ext. [phone] Second and Fourth Thursday of each month, opens at 6:30 AM; Clinic ends at 9 AM.  Patients are seen on a first-come first-served basis, and a limited number are seen during each clinic.   [facility]  [address] Ether [place], [place] [phone]   Eligibility Requirements You must have lived in [place], [place], or [place] counties for at least the last three months.   You cannot be eligible for state or federal sponsored [place], including [employer], [place], or [employer].   You generally  cannot be eligible for healthcare insurance through your employer.    How to apply: Eligibility screenings are held every Tuesday and Wednesday afternoon from 1:00 pm until 4:00 pm. You do not need an appointment for the interview!  [facility] [address], [place], [place] [phone]   [facility] Health Department  [phone]   [facility] Health Department  [phone]   [facility]  [phone]  Behavioral Health Resources in the Community: Intensive Outpatient Programs Organization         Address  Phone  Notes  Harriston Hardin. [address], [place], [place] [phone]   [facility] Outpatient [address] [place], [place]   ADS: Alcohol & Drug S[facility] [address] [place], [place]   [place] [address],  [place], [place] or [phone]   Substance Abuse Resources Organization         Address  Phone  Notes  Al[facility] and Drug Services  [phone]   [place]  [phone]   The [place]   Chinita Pester  [phone]   Residential & Outpatient Substance Abuse Program  [phone]   Psychological Services Organization         Address  Phone  Notes  [facility] [place]  [place]  [phone]   [place] [address], [place] or [phone]    Mobile Crisis Teams Organization         Address  Phone  Notes  [facility], Mobile Crisis Care Unit  [phone]   [facility]  [address] [place], [place]   [place] Levels [address] [place] [phone]    Self-Help/Support Groups Organization         Address  Phone             Notes  [place] of [place] - variety of support groups  Forest Call for more  information  Narcotics Anonymous (NA), Caring Services [address], [employer] [place]  2 meetings at this location   Special educational needs teacher         Address  Phone  Notes  ASAP Residential Treatment [place],    [place]  [phone]   [facility]  [address], [place] 921194, [place], [place]   [place], [place] [phone] Admissions: 8am-3pm M-F  Incentives Substance [place] [address],    [place], [place] [phone]   [facility] [address] [place], [place], [place]   [facility] [address],  [place], [place]   Insight Programs - Intensive Outpatient [address], [place], [place]   [facility] ([place] [place].,  [place], [place] [phone] or [phone]   [facility] Services (RTS) [address], [place], [place] Accepts Medicaid  [facility] [place] [address]  [place] [phone] Substance Abuse/Addiction Treatment   [facility] Organization         Address  Phone  Notes  [facility]  [phone]   Domenic Schwab, PhD [address] Arlis [place], [place]   [phone] or [phone]   [place], [place] [phone]   [place] [address] [place], [place] [phone] Insurance/Medicaid/sponsorship through [employer] and Families [address], [place]                                    [place], [place] [phone] [place],  [place] [phone]    Dr. Adele Schilder  [phone]   [facility] Dept. 1) [address] [place] 2) [place] 3)  [place] [address], [place] [phone]  [phone]   [facility] [phone] or [phone] (After Hours)

## 2015-02-12 NOTE — ED Notes (Signed)
Beverly [name] at bedside rt orange card.

## 2015-09-17 ENCOUNTER — Emergency Department (HOSPITAL_COMMUNITY)
Admission: EM | Admit: 2015-09-17 | Discharge: 2015-09-17 | Disposition: A | Discharge status: Home / Self Care | Payer: Medicaid Other | Attending: Emergency Medicine | Admitting: Emergency Medicine

## 2015-09-17 ENCOUNTER — Encounter (HOSPITAL_COMMUNITY): Payer: Self-pay | Admitting: Neurology

## 2015-09-17 DIAGNOSIS — R112 Nausea with vomiting, unspecified: Secondary | ICD-10-CM | POA: Diagnosis present

## 2015-09-17 DIAGNOSIS — G43909 Migraine, unspecified, not intractable, without status migrainosus: Secondary | ICD-10-CM | POA: Diagnosis not present

## 2015-09-17 DIAGNOSIS — R1084 Generalized abdominal pain: Secondary | ICD-10-CM | POA: Diagnosis not present

## 2015-09-17 DIAGNOSIS — Z720 Tobacco use: Secondary | ICD-10-CM | POA: Diagnosis not present

## 2015-09-17 DIAGNOSIS — R197 Diarrhea, unspecified: Secondary | ICD-10-CM | POA: Diagnosis not present

## 2015-09-17 DIAGNOSIS — Z3202 Encounter for pregnancy test, result negative: Secondary | ICD-10-CM | POA: Insufficient documentation

## 2015-09-17 DIAGNOSIS — R509 Fever, unspecified: Secondary | ICD-10-CM | POA: Diagnosis not present

## 2015-09-17 LAB — COMPREHENSIVE METABOLIC PANEL
ALT: 14 U/L (ref 14–54)
AST: 22 U/L (ref 15–41)
Albumin: 4 g/dL (ref 3.5–5.0)
Alkaline Phosphatase: 72 U/L (ref 38–126)
Anion gap: 11 (ref 5–15)
BUN: 13 mg/dL (ref 6–20)
CHLORIDE: 108 mmol/L (ref 101–111)
CO2: 20 mmol/L — ABNORMAL LOW (ref 22–32)
CREATININE: 1.05 mg/dL — AB (ref 0.44–1.00)
Calcium: 9.4 mg/dL (ref 8.9–10.3)
Glucose, Bld: 149 mg/dL — ABNORMAL HIGH (ref 65–99)
Potassium: 3.6 mmol/L (ref 3.5–5.1)
Sodium: 139 mmol/L (ref 135–145)
Total Bilirubin: 0.9 mg/dL (ref 0.3–1.2)
Total Protein: 7.3 g/dL (ref 6.5–8.1)

## 2015-09-17 LAB — URINALYSIS, ROUTINE W REFLEX MICROSCOPIC
Bilirubin Urine: NEGATIVE
Glucose, UA: NEGATIVE mg/dL
Hgb urine dipstick: NEGATIVE
KETONES UR: 15 mg/dL — AB
Nitrite: NEGATIVE
PH: 7.5 (ref 5.0–8.0)
PROTEIN: NEGATIVE mg/dL
Specific Gravity, Urine: 1.017 (ref 1.005–1.030)
Urobilinogen, UA: 1 mg/dL (ref 0.0–1.0)

## 2015-09-17 LAB — URINE MICROSCOPIC-ADD ON

## 2015-09-17 LAB — CBC
HCT: 39.1 % (ref 36.0–46.0)
Hemoglobin: 13.6 g/dL (ref 12.0–15.0)
MCH: 28.5 pg (ref 26.0–34.0)
MCHC: 34.8 g/dL (ref 30.0–36.0)
MCV: 81.8 fL (ref 78.0–100.0)
PLATELETS: 229 10*3/uL (ref 150–400)
RBC: 4.78 MIL/uL (ref 3.87–5.11)
RDW: 13 % (ref 11.5–15.5)
WBC: 8.5 10*3/uL (ref 4.0–10.5)

## 2015-09-17 LAB — LIPASE, BLOOD: LIPASE: 26 U/L (ref 22–51)

## 2015-09-17 LAB — PREGNANCY, URINE: PREG TEST UR: NEGATIVE

## 2015-09-17 LAB — I-STAT TROPONIN, ED: Troponin i, poc: 0 ng/mL (ref 0.00–0.08)

## 2015-09-17 MED ORDER — ONDANSETRON 4 MG PO TBDP: 4.0000 mg | ORAL_TABLET | Freq: Once | ORAL | Status: DC

## 2015-09-17 MED ORDER — METOCLOPRAMIDE HCL 5 MG/ML IJ SOLN
10.0000 mg | INTRAMUSCULAR | Status: AC
  Administered 2015-09-17: 10 mg via INTRAVENOUS
  Filled 2015-09-17: qty 2

## 2015-09-17 MED ORDER — METOCLOPRAMIDE HCL 10 MG PO TABS: 10.0000 mg | ORAL_TABLET | Freq: Four times a day (QID) | ORAL | Status: DC

## 2015-09-17 MED ORDER — SODIUM CHLORIDE 0.9 % IV BOLUS (SEPSIS)
1000.0000 mL | Freq: Once | INTRAVENOUS | Status: AC
  Administered 2015-09-17: 1000 mL via INTRAVENOUS

## 2015-09-17 MED ORDER — ONDANSETRON HCL 4 MG/2ML IJ SOLN
4.0000 mg | Freq: Once | INTRAMUSCULAR | Status: AC
  Administered 2015-09-17: 4 mg via INTRAVENOUS
  Filled 2015-09-17: qty 2

## 2015-09-17 NOTE — ED Provider Notes (Signed)
CSN: [id number]     Arrival date & time 09/17/15  0945 History   First MD Initiated Contact with Patient 09/17/15 [phone]     Chief Complaint  Patient presents with  . Emesis     (Consider location/radiation/quality/duration/timing/severity/associated sxs/prior Treatment) Patient is a 34 y.o. female presenting with vomiting. The history is provided by the patient and medical records.  Emesis  34 year old female with history of migraine headaches, presenting to the ED for nausea and vomiting for the past 48 hours after eating a steak and cheese sub at the mall.  She states sub tasted fine, but immediately afterwards she began to feel sick.  She states she has had persistent nausea/vomiting since then-- all episodes have been non-bloody and non-bilious.  She endorses subjective fever and chills, has not formally checked her temperature.  She also endorses some watery diarrhea and generalized abdominal discomfort which she describes as an "ache from vomiting".  She has been trying to drink pepsi, but has not been able to eat solid food yet.  No hx of GI issues in the past or abdominal surgeries.  No intervention tried PTA.  VSS.  Past Medical History  Diagnosis Date  . Migraine    History reviewed. No pertinent past surgical history. Family History  Problem Relation Age of Onset  . Hypertension Other   . Diabetes Other   . Cancer Other    Social History  Substance Use Topics  . Smoking status: Current Every Day Smoker    Types: Cigarettes  . Smokeless tobacco: None  . Alcohol Use: No   OB History    No data available     Review of Systems  Gastrointestinal: Positive for nausea and vomiting.  All other systems reviewed and are negative.     Allergies  Review of patient's allergies indicates no known allergies.  Home Medications   Prior to Admission medications   Medication Sig Start Date End Date Taking? Authorizing Provider  Aspirin-Acetaminophen-Caffeine (HEADACHE RELIEF  PO) Take 2 tablets by mouth every 4 (four) hours as needed (headache).    Historical Provider, MD  cephALEXin (KEFLEX) 500 MG capsule Take 1 capsule (500 mg total) by mouth 4 (four) times daily. Patient not taking: Reported on 02/12/2015 06/04/14   Derwood Kaplan, MD  HYDROcodone-acetaminophen (NORCO/VICODIN) 5-325 MG per tablet Take 1 tablet by mouth every 6 (six) hours as needed. Patient not taking: Reported on 02/12/2015 06/04/14   Derwood Kaplan, MD  ibuprofen (ADVIL,MOTRIN) 600 MG tablet Take 600 mg by mouth every 6 (six) hours as needed for moderate pain.    Historical Provider, MD  promethazine (PHENERGAN) 25 MG tablet Take 1 tablet (25 mg total) by mouth every 6 (six) hours as needed for nausea. Patient not taking: Reported on 02/12/2015 06/04/14   Derwood Kaplan, MD  sulfamethoxazole-trimethoprim (SEPTRA DS) 800-160 MG per tablet Take 1 tablet by mouth every 12 (twelve) hours. Patient not taking: Reported on 02/12/2015 06/04/14   Derwood Kaplan, MD  SUMAtriptan (IMITREX) 100 MG tablet Take 100 mg by mouth every 2 (two) hours as needed for migraine or headache. May repeat in 2 hours if headache persists or recurs.    Historical Provider, MD   BP 145/76 mmHg  Pulse 87  Temp(Src) 98.6 F (37 C) (Oral)  Resp 18  SpO2 94%   Physical Exam  Constitutional: She is oriented to person, place, and time. She appears well-developed and well-nourished. No distress.  HENT:  Head: Normocephalic and atraumatic.  Mouth/Throat: Oropharynx is  clear and moist.  Eyes: Conjunctivae and EOM are normal. Pupils are equal, round, and reactive to light.  Neck: Normal range of motion. Neck supple.  Cardiovascular: Normal rate, regular rhythm and normal heart sounds.   Pulmonary/Chest: Effort normal and breath sounds normal. No respiratory distress. She has no wheezes.  Abdominal: Soft. Bowel sounds are normal. There is no tenderness. There is no guarding.  Musculoskeletal: Normal range of motion. She exhibits no  edema.  Neurological: She is alert and oriented to person, place, and time.  Skin: Skin is warm and dry. She is not diaphoretic.  Psychiatric: She has a normal mood and affect.  Nursing note and vitals reviewed.   ED Course  Procedures (including critical care time) Labs Review Labs Reviewed  COMPREHENSIVE METABOLIC PANEL - Abnormal; Notable for the following:    CO2 20 (*)    Glucose, Bld 149 (*)    Creatinine, Ser 1.05 (*)    All other components within normal limits  URINALYSIS, ROUTINE W REFLEX MICROSCOPIC (NOT AT [facility]) - Abnormal; Notable for the following:    APPearance CLOUDY (*)    Ketones, ur 15 (*)    Leukocytes, UA SMALL (*)    All other components within normal limits  URINE MICROSCOPIC-ADD ON - Abnormal; Notable for the following:    Squamous Epithelial / LPF MANY (*)    Bacteria, UA MANY (*)    All other components within normal limits  LIPASE, BLOOD  CBC  PREGNANCY, URINE  I-STAT TROPOININ, ED    Imaging Review No results found. I have personally reviewed and evaluated these images and lab results as part of my medical decision-making.   EKG Interpretation None      MDM   Final diagnoses:  Nausea and vomiting, vomiting of unspecified type   34 year old female here with abdominal discomfort, nausea, and vomiting after eating cheesesteak 2 days ago.  Patient is afebrile, nontoxic. Her abdominal exam is benign. She is not actively vomiting but reports continued nausea. Lab work was obtained which is reassuring. UA appears contaminated, patient has no current urinary symptoms. Patient was treated with IV fluids, Reglan, and Zofran with significant improvement of her symptoms. She is tolerating oral fluids without difficulty. She's had no active vomiting here in the emergency department. Patient appears stable for discharge. I have recommended continued supportive care at home including gentle diet and progress back to normal as tolerated. Rx reglan.  She will  follow-up with her PCP.  Discussed plan with patient, he/she acknowledged understanding and agreed with plan of care.  Return precautions given for new or worsening symptoms.  Garlon Hatchet, PA-C 09/17/15 1444  Gilda Crease, MD 09/17/15 [phone]

## 2015-09-17 NOTE — ED Notes (Signed)
Pt reports n/v since Tuesday after she ate steak and cheese sub at the mall. Woke up this morning at 0300 with n/v. C/o generalized abd pain.

## 2015-09-17 NOTE — Discharge Instructions (Signed)
Take the prescribed medication as directed. May wish to start with fluids and gentle solids (toast, bananas, crackers, broth, etc) and progress back to normal diet as tolerated.   Return to the ED for new or worsening symptoms.

## 2015-12-18 ENCOUNTER — Encounter (HOSPITAL_COMMUNITY): Payer: Self-pay | Admitting: *Deleted

## 2015-12-18 ENCOUNTER — Emergency Department (HOSPITAL_COMMUNITY)
Admission: EM | Admit: 2015-12-18 | Discharge: 2015-12-18 | Discharge status: Against Medical Advice | Payer: Medicaid Other | Attending: Emergency Medicine | Admitting: Emergency Medicine

## 2015-12-18 DIAGNOSIS — R109 Unspecified abdominal pain: Secondary | ICD-10-CM | POA: Diagnosis not present

## 2015-12-18 DIAGNOSIS — F17200 Nicotine dependence, unspecified, uncomplicated: Secondary | ICD-10-CM | POA: Diagnosis not present

## 2015-12-18 DIAGNOSIS — R1110 Vomiting, unspecified: Secondary | ICD-10-CM | POA: Insufficient documentation

## 2015-12-18 LAB — COMPREHENSIVE METABOLIC PANEL
ALK PHOS: 74 U/L (ref 38–126)
ALT: 18 U/L (ref 14–54)
ANION GAP: 11 (ref 5–15)
AST: 19 U/L (ref 15–41)
Albumin: 4 g/dL (ref 3.5–5.0)
BUN: 8 mg/dL (ref 6–20)
CALCIUM: 9.6 mg/dL (ref 8.9–10.3)
CO2: 22 mmol/L (ref 22–32)
Chloride: 105 mmol/L (ref 101–111)
Creatinine, Ser: 0.85 mg/dL (ref 0.44–1.00)
Glucose, Bld: 115 mg/dL — ABNORMAL HIGH (ref 65–99)
Potassium: 3.5 mmol/L (ref 3.5–5.1)
SODIUM: 138 mmol/L (ref 135–145)
TOTAL PROTEIN: 7.6 g/dL (ref 6.5–8.1)
Total Bilirubin: 0.8 mg/dL (ref 0.3–1.2)

## 2015-12-18 LAB — CBC
HCT: 38.2 % (ref 36.0–46.0)
Hemoglobin: 12.9 g/dL (ref 12.0–15.0)
MCH: 27.3 pg (ref 26.0–34.0)
MCHC: 33.8 g/dL (ref 30.0–36.0)
MCV: 80.9 fL (ref 78.0–100.0)
Platelets: 293 K/uL (ref 150–400)
RBC: 4.72 MIL/uL (ref 3.87–5.11)
RDW: 12.9 % (ref 11.5–15.5)
WBC: 12.6 K/uL — ABNORMAL HIGH (ref 4.0–10.5)

## 2015-12-18 LAB — I-STAT BETA HCG BLOOD, ED (MC, WL, AP ONLY)

## 2015-12-18 LAB — LIPASE, BLOOD: Lipase: 31 U/L (ref 11–51)

## 2015-12-18 MED ORDER — ONDANSETRON 4 MG PO TBDP
ORAL_TABLET | ORAL | Status: AC
  Filled 2015-12-18: qty 1

## 2015-12-18 MED ORDER — ONDANSETRON 4 MG PO TBDP
4.0000 mg | ORAL_TABLET | Freq: Once | ORAL | Status: AC | PRN
  Administered 2015-12-18: 4 mg via ORAL

## 2015-12-18 NOTE — ED Notes (Signed)
Pt called multiple times with no answer.

## 2015-12-18 NOTE — ED Notes (Signed)
Pt reports onset last night of mid abd pain "feels like knot in her stomach" and n/v. Denies diarrhea. Last bm was last night.

## 2015-12-18 NOTE — ED Notes (Signed)
Attempted to call pt with no answer 

## 2015-12-18 NOTE — ED Notes (Signed)
Called for pt to be brought back to FT room 2X, no response.

## 2016-01-29 MED FILL — METOCLOPRAMIDE 10 MG TABLET: 10 | 8 days supply | Qty: 30 | Fill #0

## 2016-02-16 ENCOUNTER — Emergency Department (HOSPITAL_COMMUNITY)
Admission: EM | Admit: 2016-02-16 | Discharge: 2016-02-17 | Disposition: A | Discharge status: Home / Self Care | Payer: Medicaid Other | Attending: Emergency Medicine | Admitting: Emergency Medicine

## 2016-02-16 ENCOUNTER — Encounter (HOSPITAL_COMMUNITY): Payer: Self-pay | Admitting: *Deleted

## 2016-02-16 DIAGNOSIS — G43909 Migraine, unspecified, not intractable, without status migrainosus: Secondary | ICD-10-CM | POA: Diagnosis not present

## 2016-02-16 DIAGNOSIS — R1013 Epigastric pain: Secondary | ICD-10-CM | POA: Diagnosis present

## 2016-02-16 DIAGNOSIS — F17210 Nicotine dependence, cigarettes, uncomplicated: Secondary | ICD-10-CM | POA: Insufficient documentation

## 2016-02-16 LAB — COMPREHENSIVE METABOLIC PANEL
ALBUMIN: 3.9 g/dL (ref 3.5–5.0)
ALK PHOS: 70 U/L (ref 38–126)
ALT: 19 U/L (ref 14–54)
ANION GAP: 10 (ref 5–15)
AST: 19 U/L (ref 15–41)
BILIRUBIN TOTAL: 0.7 mg/dL (ref 0.3–1.2)
BUN: 11 mg/dL (ref 6–20)
CALCIUM: 8.9 mg/dL (ref 8.9–10.3)
CO2: 24 mmol/L (ref 22–32)
Chloride: 107 mmol/L (ref 101–111)
Creatinine, Ser: 0.85 mg/dL (ref 0.44–1.00)
GFR calc Af Amer: >60 mL/min (ref >60)
GFR calc non Af Amer: >60 mL/min (ref >60)
GLUCOSE: 111 mg/dL — AB (ref 65–99)
Potassium: 3.5 mmol/L (ref 3.5–5.1)
Sodium: 141 mmol/L (ref 135–145)
TOTAL PROTEIN: 7.1 g/dL (ref 6.5–8.1)

## 2016-02-16 LAB — CBC
HCT: 36.2 % (ref 36.0–46.0)
HEMOGLOBIN: 12.3 g/dL (ref 12.0–15.0)
MCH: 28.2 pg (ref 26.0–34.0)
MCHC: 34 g/dL (ref 30.0–36.0)
MCV: 83 fL (ref 78.0–100.0)
Platelets: 281 10*3/uL (ref 150–400)
RBC: 4.36 MIL/uL (ref 3.87–5.11)
RDW: 12.9 % (ref 11.5–15.5)
WBC: 6.7 10*3/uL (ref 4.0–10.5)

## 2016-02-16 LAB — URINE MICROSCOPIC-ADD ON: RBC / HPF: NONE SEEN RBC/hpf (ref 0–5)

## 2016-02-16 LAB — URINALYSIS, ROUTINE W REFLEX MICROSCOPIC
BILIRUBIN URINE: NEGATIVE
Glucose, UA: NEGATIVE mg/dL
KETONES UR: NEGATIVE mg/dL
NITRITE: NEGATIVE
PROTEIN: NEGATIVE mg/dL
SPECIFIC GRAVITY, URINE: 1.014 (ref 1.005–1.030)
pH: 6 (ref 5.0–8.0)

## 2016-02-16 LAB — LIPASE, BLOOD: Lipase: 31 U/L (ref 11–51)

## 2016-02-16 MED ORDER — OMEPRAZOLE 20 MG PO CPDR: 20.0000 mg | DELAYED_RELEASE_CAPSULE | Freq: Every day | ORAL | Status: AC

## 2016-02-16 NOTE — Discharge Instructions (Signed)
Hernia, Adult A hernia is the bulging of an organ or tissue through a weak spot in the muscles of the abdomen (abdominal wall). Hernias develop most often near the navel or groin. There are many kinds of hernias. Common kinds include:  Femoral hernia. This kind of hernia develops under the groin in the upper thigh area.  Inguinal hernia. This kind of hernia develops in the groin or scrotum.  Umbilical hernia. This kind of hernia develops near the navel.  Hiatal hernia. This kind of hernia causes part of the stomach to be pushed up into the chest.  Incisional hernia. This kind of hernia bulges through a scar from an abdominal surgery. CAUSES This condition may be caused by:  Heavy lifting.  Coughing over a long period of time.  Straining to have a bowel movement.  An incision made during an abdominal surgery.  A birth defect (congenital defect).  Excess weight or obesity.  Smoking.  Poor nutrition.  Cystic fibrosis.  Excess fluid in the abdomen.  Undescended testicles. SYMPTOMS Symptoms of a hernia include:  A lump on the abdomen. This is the first sign of a hernia. The lump may become more obvious with standing, straining, or coughing. It may get bigger over time if it is not treated or if the condition causing it is not treated.  Pain. A hernia is usually painless, but it may become painful over time if treatment is delayed. The pain is usually dull and may get worse with standing or lifting heavy objects. Sometimes a hernia gets tightly squeezed in the weak spot (strangulated) or stuck there (incarcerated) and causes additional symptoms. These symptoms may include:  Vomiting.  Nausea.  Constipation.  Irritability. DIAGNOSIS A hernia may be diagnosed with:  A physical exam. During the exam your health care provider may ask you to cough or to make a specific movement, because a hernia is usually more visible when you move.  Imaging tests. These can  include:  X-rays.  Ultrasound.  CT scan. TREATMENT A hernia that is small and painless may not need to be treated. A hernia that is large or painful may be treated with surgery. Inguinal hernias may be treated with surgery to prevent incarceration or strangulation. Strangulated hernias are always treated with surgery, because lack of blood to the trapped organ or tissue can cause it to die. Surgery to treat a hernia involves pushing the bulge back into place and repairing the weak part of the abdomen. HOME CARE INSTRUCTIONS  Avoid straining.  Do not lift anything heavier than 10 lb (4.5 kg).  Lift with your leg muscles, not your back muscles. This helps avoid strain.  When coughing, try to cough gently.  Prevent constipation. Constipation leads to straining with bowel movements, which can make a hernia worse or cause a hernia repair to break down. You can prevent constipation by:  Eating a high-fiber diet that includes plenty of fruits and vegetables.  Drinking enough fluids to keep your urine clear or pale yellow. Aim to drink 6-8 glasses of water per day.  Using a stool softener as directed by your health care provider.  Lose weight, if you are overweight.  Do not use any tobacco products, including cigarettes, chewing tobacco, or electronic cigarettes. If you need help quitting, ask your health care provider.  Keep all follow-up visits as directed by your health care provider. This is important. Your health care provider may need to monitor your condition. SEEK MEDICAL CARE IF:  You have  swelling, redness, and pain in the affected area.  Your bowel habits change. SEEK IMMEDIATE MEDICAL CARE IF:  You have a fever.  You have abdominal pain that is getting worse.  You feel nauseous or you vomit.  You cannot push the hernia back in place by gently pressing on it while you are lying down.  The hernia:  Changes in shape or size.  Is stuck outside the  abdomen.  Becomes discolored.  Feels hard or tender.   This information is not intended to replace advice given to you by your health care provider. Make sure you discuss any questions you have with your health care provider.   Document Released: 12/05/2005 Document Revised: 12/26/2014 Document Reviewed: 10/15/2014 Elsevier Interactive Patient Education 2016 [employer]. Food Choices for Gastroesophageal Reflux Disease, Adult When you have gastroesophageal reflux disease (GERD), the foods you eat and your eating habits are very important. Choosing the right foods can help ease the discomfort of GERD. WHAT GENERAL GUIDELINES DO I NEED TO FOLLOW?  Choose fruits, vegetables, whole grains, low-fat dairy products, and low-fat meat, fish, and poultry.  Limit fats such as oils, salad dressings, butter, nuts, and avocado.  Keep a food diary to identify foods that cause symptoms.  Avoid foods that cause reflux. These may be different for different people.  Eat frequent small meals instead of three large meals each day.  Eat your meals slowly, in a relaxed setting.  Limit fried foods.  Cook foods using methods other than frying.  Avoid drinking alcohol.  Avoid drinking large amounts of liquids with your meals.  Avoid bending over or lying down until 2-3 hours after eating. WHAT FOODS ARE NOT RECOMMENDED? The following are some foods and drinks that may worsen your symptoms: Vegetables Tomatoes. Tomato juice. Tomato and spaghetti sauce. Chili peppers. Onion and garlic. Horseradish. Fruits Oranges, grapefruit, and lemon (fruit and juice). Meats High-fat meats, fish, and poultry. This includes hot dogs, ribs, ham, sausage, salami, and bacon. Dairy Whole milk and chocolate milk. Sour cream. Cream. Butter. Ice cream. Cream cheese.  Beverages Coffee and tea, with or without caffeine. Carbonated beverages or energy drinks. Condiments Hot sauce. Barbecue sauce.   Sweets/Desserts Chocolate and cocoa. Donuts. Peppermint and spearmint. Fats and Oils High-fat foods, including [place] fries and potato chips. Other Vinegar. Strong spices, such as black pepper, white pepper, red pepper, cayenne, curry powder, cloves, ginger, and chili powder. The items listed above may not be a complete list of foods and beverages to avoid. Contact your dietitian for more information.   This information is not intended to replace advice given to you by your health care provider. Make sure you discuss any questions you have with your health care provider.   Document Released: 12/05/2005 Document Revised: 12/26/2014 Document Reviewed: 10/09/2013 Elsevier Interactive Patient Education 2016 [employer] Inc[employer] Low-Fat Diet for Pancreatitis or Gallbladder Conditions A low-fat diet can be helpful if you have pancreatitis or a gallbladder condition. With these conditions, your pancreas and gallbladder have trouble digesting fats. A healthy eating plan with less fat will help rest your pancreas and gallbladder and reduce your symptoms. WHAT DO I NEED TO KNOW ABOUT THIS DIET?  Eat a low-fat diet.  Reduce your fat intake to less than 20-30% of your total daily calories. This is less than 50-60 g of fat per day.  Remember that you need some fat in your diet. Ask your dietician what your daily goal should be.  Choose nonfat and low-fat healthy foods. Look  for the words "nonfat," "low fat," or "fat free."  As a guide, look on the label and choose foods with less than 3 g of fat per serving. Eat only one serving.  Avoid alcohol.  Do not smoke. If you need help quitting, talk with your health care provider.  Eat small frequent meals instead of three large heavy meals. WHAT FOODS CAN I EAT? Grains Include healthy grains and starches such as potatoes, wheat bread, fiber-rich cereal, and brown rice. Choose whole grain options whenever possible. In adults, whole grains should account for  45-65% of your daily calories.  Fruits and Vegetables Eat plenty of fruits and vegetables. Fresh fruits and vegetables add fiber to your diet. Meats and Other Protein Sources Eat lean meat such as chicken and pork. Trim any fat off of meat before cooking it. Eggs, fish, and beans are other sources of protein. In adults, these foods should account for 10-35% of your daily calories. Dairy Choose low-fat milk and dairy options. Dairy includes fat and protein, as well as calcium.  Fats and Oils Limit high-fat foods such as fried foods, sweets, baked goods, sugary drinks.  Other Creamy sauces and condiments, such as mayonnaise, can add extra fat. Think about whether or not you need to use them, or use smaller amounts or low fat options. WHAT FOODS ARE NOT RECOMMENDED?  High fat foods, such as:  [employer].  Ice cream.  [place] toast.  Sweet rolls.  Pizza.  Cheese bread.  Foods covered with batter, butter, creamy sauces, or cheese.  Fried foods.  Sugary drinks and desserts.  Foods that cause gas or bloating   This information is not intended to replace advice given to you by your health care provider. Make sure you discuss any questions you have with your health care provider.   Document Released: 12/10/2013 Document Reviewed: 12/10/2013 Elsevier Interactive Patient Education [employer]

## 2016-02-16 NOTE — ED Provider Notes (Signed)
CSN: [id number]     Arrival date & time 02/16/16  1937 History   First MD Initiated Contact with Patient 02/16/16 2302     Chief Complaint  Patient presents with  . Abdominal Pain     (Consider location/radiation/quality/duration/timing/severity/associated sxs/prior Treatment) HPI Patient has had intermittent problems for almost 3 months of intermittent abdominal pain. She reports sometimes she can feel a bulge about the size of a tennis ball in her epigastric region and close to her umbilicus. Today she reports she had a pain attack she was driving. She reports she had to pull over and when she came to the emergency department, she sat in the waiting room and put pressure on the area until it went down. She reports now she has no pain. She reports sometimes however doesn't always go down very quickly. When it is out, there are several positions that she cannot be in due to pain. She also reports she gets vomiting and worsening pain with eating at times. No fevers. She reports that she has had medications for reflux in the past with some relief but she has run out of those. Past Medical History  Diagnosis Date  . Migraine    History reviewed. No pertinent past surgical history. Family History  Problem Relation Age of Onset  . Hypertension Other   . Diabetes Other   . Cancer Other    Social History  Substance Use Topics  . Smoking status: Current Every Day Smoker    Types: Cigarettes  . Smokeless tobacco: None  . Alcohol Use: No   OB History    No data available     Review of Systems  10 Systems reviewed and are negative for acute change except as noted in the HPI.   Allergies  Review of patient's allergies indicates no known allergies.  Home Medications   Prior to Admission medications   Medication Sig Start Date End Date Taking? Authorizing Provider  Aspirin-Acetaminophen-Caffeine (HEADACHE RELIEF PO) Take 2 tablets by mouth every 4 (four) hours as needed (headache).    Yes Historical Provider, MD  cephALEXin (KEFLEX) 500 MG capsule Take 1 capsule (500 mg total) by mouth 4 (four) times daily. Patient not taking: Reported on 02/12/2015 06/04/14   Derwood Kaplan, MD  HYDROcodone-acetaminophen (NORCO/VICODIN) 5-325 MG per tablet Take 1 tablet by mouth every 6 (six) hours as needed. Patient not taking: Reported on 02/12/2015 06/04/14   Derwood Kaplan, MD  metoCLOPramide (REGLAN) 10 MG tablet Take 1 tablet (10 mg total) by mouth every 6 (six) hours. Patient not taking: Reported on 02/16/2016 09/17/15   Garlon Hatchet, PA-C  omeprazole (PRILOSEC) 20 MG capsule Take 1 capsule (20 mg total) by mouth daily. 02/16/16   Arby Barrette, MD  promethazine (PHENERGAN) 25 MG tablet Take 1 tablet (25 mg total) by mouth every 6 (six) hours as needed for nausea. Patient not taking: Reported on 02/12/2015 06/04/14   Derwood Kaplan, MD  sulfamethoxazole-trimethoprim (SEPTRA DS) 800-160 MG per tablet Take 1 tablet by mouth every 12 (twelve) hours. Patient not taking: Reported on 02/12/2015 06/04/14   Derwood Kaplan, MD  SUMAtriptan (IMITREX) 100 MG tablet Take 100 mg by mouth every 2 (two) hours as needed for migraine or headache. May repeat in 2 hours if headache persists or recurs.    Historical Provider, MD   BP 130/90 mmHg  Pulse 60  Temp(Src) 98.3 F (36.8 C) (Oral)  Resp 20  SpO2 99% Physical Exam  Constitutional: She is oriented to person,  place, and time. She appears well-developed and well-nourished.  HENT:  Head: Normocephalic and atraumatic.  Eyes: EOM are normal. Pupils are equal, round, and reactive to light.  Neck: Neck supple.  Cardiovascular: Normal rate, regular rhythm, normal heart sounds and intact distal pulses.   Pulmonary/Chest: Effort normal and breath sounds normal.  Abdominal: Soft. Bowel sounds are normal. She exhibits no distension. There is no tenderness.  Musculoskeletal: Normal range of motion. She exhibits no edema.  Neurological: She is alert and  oriented to person, place, and time. She has normal strength. Coordination normal. GCS eye subscore is 4. GCS verbal subscore is 5. GCS motor subscore is 6.  Skin: Skin is warm, dry and intact.  Psychiatric: She has a normal mood and affect.    ED Course  Procedures (including critical care time) Labs Review Labs Reviewed  COMPREHENSIVE METABOLIC PANEL - Abnormal; Notable for the following:    Glucose, Bld 111 (*)    All other components within normal limits  URINALYSIS, ROUTINE W REFLEX MICROSCOPIC (NOT AT [facility]) - Abnormal; Notable for the following:    APPearance CLOUDY (*)    Hgb urine dipstick MODERATE (*)    Leukocytes, UA TRACE (*)    All other components within normal limits  URINE MICROSCOPIC-ADD ON - Abnormal; Notable for the following:    Squamous Epithelial / LPF 0-5 (*)    Bacteria, UA RARE (*)    All other components within normal limits  LIPASE, BLOOD  CBC    Imaging Review No results found. I have personally reviewed and evaluated these images and lab results as part of my medical decision-making.   EKG Interpretation None      MDM   Final diagnoses:  Epigastric pain   At this time the patient is well in appearance. Her pain has resolved. She describes an abdominal wall hernia. She reports that in the emergency department she was pressing on it in the waiting room and it went back in. She also describes however in episodes and vomiting with eating at times. She'll be restarted on a PPI she is also counseled on gallbladder diet. At this time she has no right upper quadrant tenderness and no suspicion of acute cholecystitis. She is counseled for follow-up with general surgery.    Arby Barrette, MD 02/16/16 [phone]

## 2016-02-16 NOTE — ED Notes (Addendum)
Pt complains of abdominal pain/knot in her stomach for the past couple of months. Pt states she believes she has a hernia. Pt states the pain makes it difficult to walk. Pt states she has been seen for this is in the past, given GERD medication, which she states provided some relief. Pt states she vomited 3 times today.

## 2016-02-19 ENCOUNTER — Ambulatory Visit: Payer: Self-pay | Admitting: Surgery

## 2016-02-19 NOTE — H&P (Signed)
History of Present Illness Beverly [name]. Beverly [name]; 02/19/2016 12:08 PM) Patient words: NP hernia.  The patient is a 35 year old female who presents with an umbilical hernia. Referred by Dr. Donnald Garre for evaluation of umbilical hernia  This is a 35 year old female who presents with several months of an intermittent palpable bulge just above her umbilicus. When the bulge is protruding and becomes quite tender. She has been able to reduce this area. Sometimes she developed nausea 1 the bulge is protruding. She continues to have daily bowel movements. She was examined in the emergency department but the hernia had been reduced by that point. No imaging was performed. She is now referred to discuss repair of this hernia.   Other Problems Beverly [name], RMA; 02/19/2016 10:19 AM) Gastroesophageal Reflux Disease  Past Surgical History Beverly [name], RMA; 02/19/2016 10:19 AM) No pertinent past surgical history  Diagnostic Studies History Beverly [name], RMA; 02/19/2016 10:19 AM) Colonoscopy never Mammogram never Pap Smear 1-5 years ago  Allergies Beverly [name], RMA; 02/19/2016 10:19 AM) No Known Drug Allergies 02/19/2016  Medication History (Beverly [name], RMA; 02/19/2016 10:20 AM) No Current Medications Medications Reconciled  Social History Beverly [name], RMA; 02/19/2016 10:19 AM) Alcohol use Occasional alcohol use. Caffeine use Carbonated beverages, Tea. Illicit drug use Uses weekly. Tobacco use Current some day smoker.  Family History Beverly [name], RMA; 02/19/2016 10:19 AM) Cancer Father, Mother. Cerebrovascular Accident Father. Diabetes Mellitus Father, Mother, Sister. Hypertension Father, Mother, Sister. Migraine Headache Father, Sister.  Pregnancy / Birth History Beverly [name], RMA; 02/19/2016 10:19 AM) Age at menarche 6 years. Contraceptive History Intrauterine device. Gravida 2 Irregular periods Maternal age 71-20 Para 2     Review of Systems Beverly [name] RMA;  02/19/2016 10:19 AM) General Not Present- Appetite Loss, Chills, Fatigue, Fever, Night Sweats, Weight Gain and Weight Loss. Skin Present- Change in Wart/Mole. Not Present- Dryness, Hives, Jaundice, New Lesions, Non-Healing Wounds, Rash and Ulcer. HEENT Not Present- Earache, Hearing Loss, Hoarseness, Nose Bleed, Oral Ulcers, Ringing in the Ears, Seasonal Allergies, Sinus Pain, Sore Throat, Visual Disturbances, Wears glasses/contact lenses and Yellow Eyes. Respiratory Not Present- Bloody sputum, Chronic Cough, Difficulty Breathing, Snoring and Wheezing. Breast Not Present- Breast Mass, Breast Pain, Nipple Discharge and Skin Changes. Cardiovascular Not Present- Chest Pain, Difficulty Breathing Lying Down, Leg Cramps, Palpitations, Rapid Heart Rate, Shortness of Breath and Swelling of Extremities. Gastrointestinal Present- Abdominal Pain, Excessive gas, Nausea and Vomiting. Not Present- Bloating, Bloody Stool, Change in Bowel Habits, Chronic diarrhea, Constipation, Difficulty Swallowing, Gets full quickly at meals, Hemorrhoids, Indigestion and Rectal Pain. Female Genitourinary Not Present- Frequency, Nocturia, Painful Urination, Pelvic Pain and Urgency. Musculoskeletal Present- Muscle Pain. Not Present- Back Pain, Joint Pain, Joint Stiffness, Muscle Weakness and Swelling of Extremities. Neurological Present- Headaches. Not Present- Decreased Memory, Fainting, Numbness, Seizures, Tingling, Tremor, Trouble walking and Weakness. Psychiatric Not Present- Anxiety, Bipolar, Change in Sleep Pattern, Depression, Fearful and Frequent crying. Endocrine Not Present- Cold Intolerance, Excessive Hunger, Hair Changes, Heat Intolerance, Hot flashes and New Diabetes. Hematology Not Present- Easy Bruising, Excessive bleeding, Gland problems, HIV and Persistent Infections.  Vitals (Beverly [name] RMA; 02/19/2016 10:20 AM) 02/19/2016 10:20 AM Weight: 236 lb Height: 69in Body Surface Area: 2.22 m Body Mass Index: 34.85  kg/m  Temp.: 97.63F  Pulse: 62 (Regular)  BP: 136/72 (Sitting, Left Arm, Standard)      Physical Exam Beverly [name] K. Saurav Crumble [name]; 02/19/2016 12:08 PM)  The physical exam findings are as follows: Note:WDWN in NAD HEENT: EOMI, sclera anicteric Neck: No masses, no  thyromegaly Lungs: CTA bilaterally; normal respiratory effort CV: Regular rate and rhythm; no murmurs Abd: +bowel sounds, obese, soft, non-tender, small umbilical hernia just above umbilicus Ext: Well-perfused; no edema Skin: Warm, dry; no sign of jaundice    Assessment & Plan Beverly [name]; 02/19/2016 10:44 AM)  UMBILICAL HERNIA WITHOUT OBSTRUCTION AND WITHOUT GANGRENE (K42.9)  Current Plans Schedule for Surgery - Umbilical hernia repair with mesh. The surgical procedure has been discussed with the patient. Potential risks, benefits, alternative treatments, and expected outcomes have been explained. All of the patient's questions at this time have been answered. The likelihood of reaching the patient's treatment goal is good. The patient understand the proposed surgical procedure and wishes to proceed.  Beverly ArmsMatthew K. Corliss Skainssuei, [name], [name]  General/ Trauma Surgery  02/19/2016 12:09 PM

## 2018-10-31 ENCOUNTER — Encounter (HOSPITAL_BASED_OUTPATIENT_CLINIC_OR_DEPARTMENT_OTHER): Payer: Self-pay

## 2018-10-31 ENCOUNTER — Emergency Department (HOSPITAL_BASED_OUTPATIENT_CLINIC_OR_DEPARTMENT_OTHER): Payer: Self-pay

## 2018-10-31 ENCOUNTER — Other Ambulatory Visit: Payer: Self-pay

## 2018-10-31 ENCOUNTER — Emergency Department (HOSPITAL_BASED_OUTPATIENT_CLINIC_OR_DEPARTMENT_OTHER)
Admission: EM | Admit: 2018-10-31 | Discharge: 2018-10-31 | Disposition: A | Discharge status: Home / Self Care | Payer: Self-pay | Attending: Emergency Medicine | Admitting: Emergency Medicine

## 2018-10-31 DIAGNOSIS — F17210 Nicotine dependence, cigarettes, uncomplicated: Secondary | ICD-10-CM | POA: Insufficient documentation

## 2018-10-31 DIAGNOSIS — M25561 Pain in right knee: Secondary | ICD-10-CM | POA: Insufficient documentation

## 2018-10-31 DIAGNOSIS — Z79899 Other long term (current) drug therapy: Secondary | ICD-10-CM | POA: Insufficient documentation

## 2018-10-31 DIAGNOSIS — M79651 Pain in right thigh: Secondary | ICD-10-CM | POA: Insufficient documentation

## 2018-10-31 MED ORDER — PREDNISONE 20 MG PO TABS: 40.0000 mg | ORAL_TABLET | Freq: Every day | ORAL | 0 refills | Status: DC

## 2018-10-31 NOTE — ED Triage Notes (Signed)
Pt c/o pain to right knee and "feels like it's giving out"-started yesterday-pain not to right buttock, hip and thigh area-NAD-steady gait

## 2018-10-31 NOTE — Discharge Instructions (Addendum)
Follow-up with your primary care doctor for further evaluation of the knee pain.

## 2018-10-31 NOTE — ED Provider Notes (Signed)
[facility] EMERGENCY DEPARTMENT Provider Note   CSN: [id number] Arrival date & time: 10/31/18  2011     History   Chief Complaint Chief Complaint  Patient presents with  . Knee Pain    HPI Beverly [name] is a 37 y.o. female.  HPI Patient presents with right knee pain.  Has had since yesterday.  In the right knee and goes up towards the right hip.  No fall.  Has had some pain here before but no acute injury on this.  No swelling.  No fevers.  No abdominal pain.  Some mild back pain.  States the knee will give out sometimes.  Pain is dull.  States the knee feels swollen. Past Medical History:  Diagnosis Date  . Migraine     There are no active problems to display for this patient.   Past Surgical History:  Procedure Laterality Date  . HERNIA REPAIR       OB History   None      Home Medications    Prior to Admission medications   Medication Sig Start Date End Date Taking? Authorizing Provider  Aspirin-Acetaminophen-Caffeine (HEADACHE RELIEF PO) Take 2 tablets by mouth every 4 (four) hours as needed (headache).    [provider]  cephALEXin (KEFLEX) 500 MG capsule Take 1 capsule (500 mg total) by mouth 4 (four) times daily. Patient not taking: Reported on 02/12/2015 06/04/14   Derwood KaplanNanavati, Ankit, MD  HYDROcodone-acetaminophen (NORCO/VICODIN) 5-325 MG per tablet Take 1 tablet by mouth every 6 (six) hours as needed. Patient not taking: Reported on 02/12/2015 06/04/14   Derwood KaplanNanavati, Ankit, MD  metoCLOPramide (REGLAN) 10 MG tablet Take 1 tablet (10 mg total) by mouth every 6 (six) hours. Patient not taking: Reported on 02/16/2016 09/17/15   Garlon HatchetSanders, Lisa M, PA-C  omeprazole (PRILOSEC) 20 MG capsule Take 1 capsule (20 mg total) by mouth daily. 02/16/16   Arby BarrettePfeiffer, Marcy, MD  predniSONE (DELTASONE) 20 MG tablet Take 2 tablets (40 mg total) by mouth daily. 10/31/18   Benjiman CorePickering, Kayshaun Polanco, MD  promethazine (PHENERGAN) 25 MG tablet Take 1 tablet (25 mg total) by mouth  every 6 (six) hours as needed for nausea. Patient not taking: Reported on 02/12/2015 06/04/14   Derwood KaplanNanavati, Ankit, MD  sulfamethoxazole-trimethoprim (SEPTRA DS) 800-160 MG per tablet Take 1 tablet by mouth every 12 (twelve) hours. Patient not taking: Reported on 02/12/2015 06/04/14   Derwood KaplanNanavati, Ankit, MD  SUMAtriptan (IMITREX) 100 MG tablet Take 100 mg by mouth every 2 (two) hours as needed for migraine or headache. May repeat in 2 hours if headache persists or recurs.    [provider]    Family History Family History  Problem Relation Age of Onset  . Hypertension Other   . Diabetes Other   . Cancer Other     Social History Social History   Tobacco Use  . Smoking status: Current Every Day Smoker    Types: Cigarettes  . Smokeless tobacco: Never Used  Substance Use Topics  . Alcohol use: Yes    Comment: occ  . Drug use: No     Allergies   Patient has no known allergies.   Review of Systems Review of Systems  Constitutional: Negative for appetite change.  Respiratory: Negative for shortness of breath.   Cardiovascular: Negative for chest pain.  Gastrointestinal: Negative for abdominal pain.  Genitourinary: Negative for flank pain.  Musculoskeletal: Positive for back pain.       Right hip right knee pain.  Skin:  Negative for rash.  Neurological: Negative for weakness.     Physical Exam Updated Vital Signs BP (!) 141/74 (BP Location: Left Arm)   Pulse 89   Temp 98.5 F (36.9 C) (Oral)   Resp 18   Ht 5\' 9"  (1.753 m)   Wt 117.5 kg   SpO2 100%   BMI 38.25 kg/m   Physical Exam  Constitutional: She appears well-developed.  HENT:  Head: Normocephalic.  Cardiovascular: Normal rate.  Abdominal: There is no tenderness.  Musculoskeletal: She exhibits tenderness.  Mild tenderness to right knee.  Good range of motion.  No effusion.  Mild tenderness over right thigh musculature.  No rash.  Neurovascular intact in right foot.  Good range of motion right hip.    Skin: Capillary refill takes less than 2 seconds.     ED Treatments / Results  Labs (all labs ordered are listed, but only abnormal results are displayed) Labs Reviewed - No data to display  EKG None  Radiology Dg Knee Complete 4 Views Right  Result Date: 10/31/2018 CLINICAL DATA:  Right knee pain.  No known injury. EXAM: RIGHT KNEE - COMPLETE 4+ VIEW COMPARISON:  None. FINDINGS: No evidence of fracture, dislocation, or joint effusion. No evidence of arthropathy or other focal bone abnormality. Soft tissues are unremarkable. IMPRESSION: Negative. Electronically Signed   By: Gerome Sam III M.D   On: 10/31/2018 21:42    Procedures Procedures (including critical care time)  Medications Ordered in ED Medications - No data to display   Initial Impression / Assessment and Plan / ED Course  I have reviewed the triage vital signs and the nursing notes.  Pertinent labs & imaging results that were available during my care of the patient were reviewed by me and considered in my medical decision making (see chart for details).     Patient with right knee pain.  X-ray reassuring.  Pain does radiate somewhat proximally but hip is really not tender.  Good range of motion.  Doubt hip fracture.  Could be radicular however.  Discharge home.  Will give steroids to help with inflammation.  Final Clinical Impressions(s) / ED Diagnoses   Final diagnoses:  Acute pain of right knee    ED Discharge Orders         Ordered    predniSONE (DELTASONE) 20 MG tablet  Daily     10/31/18 2211           Benjiman Core, MD 10/31/18 2357

## 2018-10-31 NOTE — ED Notes (Signed)
Pt denies any pain at this time. Pt states she had pain to R knee that radiated up her thigh. Pt took ibuprofen approx 3 hours ago and has relief.

## 2018-10-31 NOTE — ED Notes (Signed)
Patient transported to X-ray 

## 2019-01-14 ENCOUNTER — Other Ambulatory Visit: Payer: Self-pay

## 2019-01-14 ENCOUNTER — Emergency Department (HOSPITAL_BASED_OUTPATIENT_CLINIC_OR_DEPARTMENT_OTHER)
Admission: EM | Admit: 2019-01-14 | Discharge: 2019-01-14 | Disposition: A | Discharge status: Home / Self Care | Payer: BLUE CROSS/BLUE SHIELD | Attending: Emergency Medicine | Admitting: Emergency Medicine

## 2019-01-14 ENCOUNTER — Encounter (HOSPITAL_BASED_OUTPATIENT_CLINIC_OR_DEPARTMENT_OTHER): Payer: Self-pay | Admitting: Emergency Medicine

## 2019-01-14 DIAGNOSIS — G43409 Hemiplegic migraine, not intractable, without status migrainosus: Secondary | ICD-10-CM | POA: Insufficient documentation

## 2019-01-14 DIAGNOSIS — F17210 Nicotine dependence, cigarettes, uncomplicated: Secondary | ICD-10-CM | POA: Insufficient documentation

## 2019-01-14 DIAGNOSIS — Z79899 Other long term (current) drug therapy: Secondary | ICD-10-CM | POA: Diagnosis not present

## 2019-01-14 DIAGNOSIS — R51 Headache: Secondary | ICD-10-CM | POA: Diagnosis present

## 2019-01-14 LAB — PREGNANCY, URINE: Preg Test, Ur: NEGATIVE

## 2019-01-14 MED ORDER — METOCLOPRAMIDE HCL 5 MG/ML IJ SOLN
10.0000 mg | Freq: Once | INTRAMUSCULAR | Status: AC
  Administered 2019-01-14: 10 mg via INTRAVENOUS
  Filled 2019-01-14: qty 2

## 2019-01-14 MED ORDER — SODIUM CHLORIDE 0.9 % IV BOLUS
1000.0000 mL | Freq: Once | INTRAVENOUS | Status: AC
  Administered 2019-01-14: 1000 mL via INTRAVENOUS

## 2019-01-14 MED ORDER — KETOROLAC TROMETHAMINE 15 MG/ML IJ SOLN
15.0000 mg | Freq: Once | INTRAMUSCULAR | Status: AC
  Administered 2019-01-14: 15 mg via INTRAVENOUS
  Filled 2019-01-14: qty 1

## 2019-01-14 MED ORDER — DIPHENHYDRAMINE HCL 50 MG/ML IJ SOLN
25.0000 mg | Freq: Once | INTRAMUSCULAR | Status: AC
  Administered 2019-01-14: 25 mg via INTRAVENOUS
  Filled 2019-01-14: qty 1

## 2019-01-14 NOTE — ED Provider Notes (Signed)
[facility] Provider Note: Lowella DellJ. Lane Iam Lipson, MD, FACEP  CSN: [id number] MRN: [id number] ARRIVAL: 01/14/19 at 0437 ROOM: [facility]   CHIEF COMPLAINT  Headache   HISTORY OF PRESENT ILLNESS  01/14/19 4:47 AM Beverly [name] is a 38 y.o. female with a history of migraines.  She is here with a 2-day history of a headache which she believes is a migraine.  The pain is located bitemporally and is throbbing.  She rates it as a 10 out of 10.  It is been associated with nausea and vomiting.  She has some photophobia with it.  She has taken Imitrex and over-the-counter sinus medication without relief.   Past Medical History:  Diagnosis Date  . Migraine     Past Surgical History:  Procedure Laterality Date  . HERNIA REPAIR      Family History  Problem Relation Age of Onset  . Hypertension Other   . Diabetes Other   . Cancer Other     Social History   Tobacco Use  . Smoking status: Current Every Day Smoker    Types: Cigarettes  . Smokeless tobacco: Never Used  Substance Use Topics  . Alcohol use: Yes    Comment: occ  . Drug use: No    Prior to Admission medications   Medication Sig Start Date End Date Taking? Authorizing Provider  Aspirin-Acetaminophen-Caffeine (HEADACHE RELIEF PO) Take 2 tablets by mouth every 4 (four) hours as needed (headache).    [provider]  cephALEXin (KEFLEX) 500 MG capsule Take 1 capsule (500 mg total) by mouth 4 (four) times daily. Patient not taking: Reported on 02/12/2015 06/04/14   Derwood KaplanNanavati, Ankit, MD  HYDROcodone-acetaminophen (NORCO/VICODIN) 5-325 MG per tablet Take 1 tablet by mouth every 6 (six) hours as needed. Patient not taking: Reported on 02/12/2015 06/04/14   Derwood KaplanNanavati, Ankit, MD  metoCLOPramide (REGLAN) 10 MG tablet Take 1 tablet (10 mg total) by mouth every 6 (six) hours. Patient not taking: Reported on 02/16/2016 09/17/15   Garlon HatchetSanders, Lisa M, PA-C  omeprazole (PRILOSEC) 20 MG capsule Take 1 capsule (20 mg total) by  mouth daily. 02/16/16   Arby BarrettePfeiffer, Marcy, MD  predniSONE (DELTASONE) 20 MG tablet Take 2 tablets (40 mg total) by mouth daily. 10/31/18   Benjiman CorePickering, Nathan, MD  promethazine (PHENERGAN) 25 MG tablet Take 1 tablet (25 mg total) by mouth every 6 (six) hours as needed for nausea. Patient not taking: Reported on 02/12/2015 06/04/14   Derwood KaplanNanavati, Ankit, MD  sulfamethoxazole-trimethoprim (SEPTRA DS) 800-160 MG per tablet Take 1 tablet by mouth every 12 (twelve) hours. Patient not taking: Reported on 02/12/2015 06/04/14   Derwood KaplanNanavati, Ankit, MD  SUMAtriptan (IMITREX) 100 MG tablet Take 100 mg by mouth every 2 (two) hours as needed for migraine or headache. May repeat in 2 hours if headache persists or recurs.    [provider]    Allergies Patient has no known allergies.   REVIEW OF SYSTEMS  Negative except as noted here or in the History of Present Illness.   PHYSICAL EXAMINATION  Initial Vital Signs Blood pressure 124/71, pulse 62, temperature 99.1 F (37.3 C), resp. rate 18, height 5\' 9"  (1.753 m), weight 113.4 kg, SpO2 99 %.  Examination General: Well-developed, well-nourished female in no acute distress; appearance consistent with age of record HENT: normocephalic; atraumatic Eyes: pupils equal, round and reactive to light; extraocular muscles intact Neck: supple Heart: regular rate and rhythm Lungs: clear to auscultation bilaterally Abdomen: soft; nondistended; nontender; bowel sounds present Extremities: No  deformity; full range of motion; pulses normal Neurologic: Awake, alert and oriented; motor function intact in all extremities and symmetric; no facial droop; normal coordination, speech and gait Skin: Warm and dry Psychiatric: Normal mood and affect   RESULTS  Summary of this visit's results, reviewed by myself:   EKG Interpretation  Date/Time:    Ventricular Rate:    PR Interval:    QRS Duration:   QT Interval:    QTC Calculation:   R Axis:     Text  Interpretation:        Laboratory Studies: Results for orders placed or performed during the hospital encounter of 01/14/19 (from the past 24 hour(s))  Pregnancy, urine     Status: None   Collection Time: 01/14/19  5:05 AM  Result Value Ref Range   Preg Test, Ur NEGATIVE NEGATIVE   Imaging Studies: No results found.  ED COURSE and MDM  Nursing notes and initial vitals signs, including pulse oximetry, reviewed.  Vitals:   01/14/19 0445  BP: 124/71  Pulse: 62  Resp: 18  Temp: 99.1 F (37.3 C)  SpO2: 99%  Weight: 113.4 kg  Height: 5\' 9"  (1.753 m)   6:01 AM Headache significantly improved after IV fluids and medications.  Patient states she is ready to go home.  PROCEDURES    ED DIAGNOSES     ICD-10-CM   1. Sporadic migraine G43.409        Kallon Caylor, MD 01/14/19 [phone]

## 2019-01-14 NOTE — ED Triage Notes (Signed)
Headache x2 days with vomiting. Hx migraines. Been taking sinus meds OTC with no relief.

## 2020-12-03 ENCOUNTER — Emergency Department (HOSPITAL_BASED_OUTPATIENT_CLINIC_OR_DEPARTMENT_OTHER): Payer: Self-pay

## 2020-12-03 ENCOUNTER — Encounter (HOSPITAL_BASED_OUTPATIENT_CLINIC_OR_DEPARTMENT_OTHER): Payer: Self-pay

## 2020-12-03 ENCOUNTER — Other Ambulatory Visit: Payer: Self-pay

## 2020-12-03 ENCOUNTER — Other Ambulatory Visit (HOSPITAL_BASED_OUTPATIENT_CLINIC_OR_DEPARTMENT_OTHER): Payer: Self-pay | Admitting: Emergency Medicine

## 2020-12-03 ENCOUNTER — Emergency Department (HOSPITAL_BASED_OUTPATIENT_CLINIC_OR_DEPARTMENT_OTHER)
Admission: EM | Admit: 2020-12-03 | Discharge: 2020-12-03 | Disposition: A | Discharge status: Home / Self Care | Payer: Self-pay | Attending: Emergency Medicine | Admitting: Emergency Medicine

## 2020-12-03 DIAGNOSIS — K047 Periapical abscess without sinus: Secondary | ICD-10-CM | POA: Insufficient documentation

## 2020-12-03 DIAGNOSIS — F17210 Nicotine dependence, cigarettes, uncomplicated: Secondary | ICD-10-CM | POA: Insufficient documentation

## 2020-12-03 LAB — BASIC METABOLIC PANEL
Anion gap: 9 (ref 5–15)
BUN: 10 mg/dL (ref 6–20)
CO2: 25 mmol/L (ref 22–32)
Calcium: 8.9 mg/dL (ref 8.9–10.3)
Chloride: 104 mmol/L (ref 98–111)
Creatinine, Ser: 0.99 mg/dL (ref 0.44–1.00)
GFR, Estimated: >60 mL/min (ref >60)
Glucose, Bld: 105 mg/dL — ABNORMAL HIGH (ref 70–99)
Potassium: 4.1 mmol/L (ref 3.5–5.1)
Sodium: 138 mmol/L (ref 135–145)

## 2020-12-03 LAB — CBC WITH DIFFERENTIAL/PLATELET
Abs Immature Granulocytes: 0.02 10*3/uL (ref 0.00–0.07)
Basophils Absolute: 0.1 10*3/uL (ref 0.0–0.1)
Basophils Relative: 1 %
Eosinophils Absolute: 0.2 10*3/uL (ref 0.0–0.5)
Eosinophils Relative: 2 %
HCT: 37.8 % (ref 36.0–46.0)
Hemoglobin: 13 g/dL (ref 12.0–15.0)
Immature Granulocytes: 0 %
Lymphocytes Relative: 29 %
Lymphs Abs: 2.7 10*3/uL (ref 0.7–4.0)
MCH: 28.6 pg (ref 26.0–34.0)
MCHC: 34.4 g/dL (ref 30.0–36.0)
MCV: 83.3 fL (ref 80.0–100.0)
Monocytes Absolute: 0.8 10*3/uL (ref 0.1–1.0)
Monocytes Relative: 8 %
Neutro Abs: 5.6 10*3/uL (ref 1.7–7.7)
Neutrophils Relative %: 60 %
Platelets: 306 10*3/uL (ref 150–400)
RBC: 4.54 MIL/uL (ref 3.87–5.11)
RDW: 12.6 % (ref 11.5–15.5)
WBC: 9.3 10*3/uL (ref 4.0–10.5)
nRBC: 0 % (ref 0.0–0.2)

## 2020-12-03 MED ORDER — CLINDAMYCIN HCL 150 MG PO CAPS: 450.0000 mg | ORAL_CAPSULE | Freq: Three times a day (TID) | ORAL | 0 refills | Status: DC

## 2020-12-03 MED ORDER — CLINDAMYCIN PHOSPHATE 600 MG/50ML IV SOLN
600.0000 mg | Freq: Once | INTRAVENOUS | Status: AC
  Administered 2020-12-03: 600 mg via INTRAVENOUS
  Filled 2020-12-03: qty 50

## 2020-12-03 MED ORDER — KETOROLAC TROMETHAMINE 30 MG/ML IJ SOLN
30.0000 mg | Freq: Once | INTRAMUSCULAR | Status: AC
  Administered 2020-12-03: 30 mg via INTRAVENOUS
  Filled 2020-12-03: qty 1

## 2020-12-03 MED ORDER — IOHEXOL 300 MG/ML  SOLN
100.0000 mL | Freq: Once | INTRAMUSCULAR | Status: AC | PRN
  Administered 2020-12-03: 75 mL via INTRAVENOUS

## 2020-12-03 MED ORDER — MORPHINE SULFATE (PF) 4 MG/ML IV SOLN
4.0000 mg | Freq: Once | INTRAVENOUS | Status: AC
  Administered 2020-12-03: 4 mg via INTRAVENOUS
  Filled 2020-12-03: qty 1

## 2020-12-03 MED ORDER — CLINDAMYCIN HCL 150 MG PO CAPS: 450.0000 mg | ORAL_CAPSULE | Freq: Three times a day (TID) | ORAL | 0 refills | Status: AC

## 2020-12-03 MED FILL — CLINDAMYCIN HCL 150 MG CAPS: 150 | 7 days supply | Qty: 63 | Fill #0

## 2020-12-03 NOTE — ED Provider Notes (Signed)
[facility] EMERGENCY DEPARTMENT Provider Note   CSN: [id number] Arrival date & time: 12/03/20  1210     History Chief Complaint  Patient presents with  . Dental Pain    Beverly [name] is a 39 y.o. female history of migraines.  Patient reports that 3 weeks prior she cracked a back upper left molar while eating some food.  She reports that she has had pain since the incident but was manageable.  Patient does not have a regular dentist and has not followed up for dental care since the event.  She reports that yesterday she started having increased pain.  Patient reports that she woke up this morning with swelling to the left side of her face and worsening pain.  Patient rates the pain as a 10 out of 10 on the pain scale, states is worse with movement and palpation; not improved with 200 mg of ibuprofen this morning.  She reports prior to this she had not have any facial swelling.  Patient reports that she was able to drink liquids this morning without difficulty.  Patient denies any fevers, chills, purulent discharge, foul taste in her mouth, bleeding gums, sore throat, or visual disturbance.  Endorses difficulty swallowing, change in voice, trismus, neck pain, and drooling.  HPI     Past Medical History:  Diagnosis Date  . Migraine     There are no problems to display for this patient.   Past Surgical History:  Procedure Laterality Date  . HERNIA REPAIR       OB History   No obstetric history on file.     Family History  Problem Relation Age of Onset  . Hypertension Other   . Diabetes Other   . Cancer Other     Social History   Tobacco Use  . Smoking status: Current Every Day Smoker    Types: Cigarettes  . Smokeless tobacco: Never Used  Vaping Use  . Vaping Use: Never used  Substance Use Topics  . Alcohol use: Yes    Comment: occ  . Drug use: No    Home Medications Prior to Admission medications   Medication Sig Start Date End Date Taking?  Authorizing Provider  Aspirin-Acetaminophen-Caffeine (HEADACHE RELIEF PO) Take 2 tablets by mouth every 4 (four) hours as needed (headache).   Yes [provider]  SUMAtriptan (IMITREX) 100 MG tablet Take 100 mg by mouth every 2 (two) hours as needed for migraine or headache. May repeat in 2 hours if headache persists or recurs.   Yes [provider]  clindamycin (CLEOCIN) 150 MG capsule Take 3 capsules (450 mg total) by mouth 3 (three) times daily for 7 days. 12/03/20 12/10/20  Haskel Schroeder, PA-C  omeprazole (PRILOSEC) 20 MG capsule Take 1 capsule (20 mg total) by mouth daily. 02/16/16   Arby Barrette, MD    Allergies    Patient has no known allergies.  Review of Systems   Review of Systems  Constitutional: Negative for chills and fever.  HENT: Positive for sinus pain, sore throat, trouble swallowing and voice change. Negative for drooling.   Eyes: Negative for discharge and visual disturbance.  Respiratory: Negative for cough and shortness of breath.   Cardiovascular: Negative for chest pain.  Gastrointestinal: Negative for abdominal pain, nausea and vomiting.  Genitourinary: Negative for difficulty urinating and dysuria.  Musculoskeletal: Negative for back pain and neck pain.  Skin: Negative for color change and rash.  Neurological: Negative for dizziness, syncope, light-headedness and headaches.  Psychiatric/Behavioral: Negative for confusion.    Physical Exam Updated Vital Signs BP 127/84 (BP Location: Left Arm)   Pulse (!) 53   Temp 98.5 F (36.9 C) (Oral)   Resp 18   Ht 5\' 9"  (1.753 m)   Wt 114.8 kg   SpO2 100%   BMI 37.36 kg/m   Physical Exam Constitutional:      General: She is not in acute distress.    Appearance: She is obese. She is ill-appearing. She is not toxic-appearing or diaphoretic.  HENT:     Head: Normocephalic.     Mouth/Throat:     Mouth: Mucous membranes are moist.     Dentition: Dental tenderness present.     Tongue:  No lesions.     Palate: No mass and lesions.      Comments: Unable to visualize uvula and pharynx due to patient's trismus Eyes:     General: Vision grossly intact. No scleral icterus.       Right eye: No discharge.        Left eye: No discharge.     Extraocular Movements: Extraocular movements intact.     Conjunctiva/sclera: Conjunctivae normal.     Pupils: Pupils are equal, round, and reactive to light.  Cardiovascular:     Rate and Rhythm: Normal rate.     Heart sounds: Normal heart sounds.  Pulmonary:     Effort: Pulmonary effort is normal.     Breath sounds: Normal breath sounds.  Abdominal:     Palpations: Abdomen is soft.     Tenderness: There is no abdominal tenderness.  Musculoskeletal:     Cervical back: Neck supple. Tenderness (Left-sided) present. No rigidity.  Skin:    General: Skin is warm and dry.  Neurological:     General: No focal deficit present.     Mental Status: She is alert.     ED Results / Procedures / Treatments   Labs (all labs ordered are listed, but only abnormal results are displayed) Labs Reviewed  BASIC METABOLIC PANEL - Abnormal; Notable for the following components:      Result Value   Glucose, Bld 105 (*)    All other components within normal limits  CBC WITH DIFFERENTIAL/PLATELET    EKG None  Radiology CT Soft Tissue Neck W Contrast  Result Date: 12/03/2020 CLINICAL DATA:  Lymphadenopathy. Left neck and facial swelling. Trismus. EXAM: CT NECK WITH CONTRAST TECHNIQUE: Multidetector CT imaging of the neck was performed using the standard protocol following the bolus administration of intravenous contrast. CONTRAST:  72mL OMNIPAQUE IOHEXOL 300 MG/ML  SOLN COMPARISON:  CT neck 06/09/2018 FINDINGS: Pharynx and larynx: Mild prominence of the palatine tonsils bilaterally similar to the prior study. No pharyngeal mass or airway compromise. Normal larynx Salivary glands: No inflammation, mass, or stone. Thyroid: Negative Lymph nodes: No  pathologic adenopathy. Right level 2 lymph node 9 mm. Left level 2 lymph node 9 mm. Lymph node size improved from the prior CT. Vascular: Normal vascular enhancement. Limited intracranial: Negative Visualized orbits: Negative Mastoids and visualized paranasal sinuses: Mucosal edema throughout the left maxillary sinus. Remaining sinuses clear Skeleton: Mild periapical lucency around right lower first molar. Progressive periapical lucencies around left upper pre molars with associated caries. Upper chest: Lung apices clear bilaterally. Other: Soft tissue swelling overlying the left maxilla compatible with cellulitis. No subperiosteal abscess. Cellulitis may be due to dental infection. IMPRESSION: Soft tissue swelling left face overlying the maxilla consistent with cellulitis. This may be due to dental  infection. Periapical lucencies around the left upper pre molars compatible with dental infection. Associated caries. Negative for soft tissue subperiosteal abscess. Electronically Signed   By: Marlan Palau M.D.   On: 12/03/2020 14:50    Procedures Procedures (including critical care time)  Medications Ordered in ED Medications  ketorolac (TORADOL) 30 MG/ML injection 30 mg (30 mg Intravenous Given 12/03/20 1412)  morphine 4 MG/ML injection 4 mg (4 mg Intravenous Given 12/03/20 1414)  iohexol (OMNIPAQUE) 300 MG/ML solution 100 mL (75 mLs Intravenous Contrast Given 12/03/20 1422)  clindamycin (CLEOCIN) IVPB 600 mg (0 mg Intravenous Stopped 12/03/20 1542)    ED Course  I have reviewed the triage vital signs and the nursing notes.  Pertinent labs & imaging results that were available during my care of the patient were reviewed by me and considered in my medical decision making (see chart for details).    MDM Rules/Calculators/A&P                          Ill-appearing 63 female in no acute distress.  On interview patient was found to be sitting upright in chair, is able to speak in full complete  sentences without difficulty, not observed to be drooling, is able to handle oral secretions without difficulty.  Patient observed to have swelling to the left side of her cheek and neck, entire left side of her face and neck is tender to palpation.  Uvula and oropharynx unable to close due to patient's trismus.  Due to concern of deep neck infection CT scan was ordered.    CT scan showed soft tissue swelling left face overlying the left maxilla compatible with cellulitis; No subperiosteal abscess; Periapical lucencies around the left upper pre molars compatible with dental infection. Associated caries. Negative for soft tissue subperiosteal abscess.  She was given Toradol and morphine with reports of improvement of her pain.  She was given IV clindamycin.  Patient was given prescription for clindamycin.  Discussed results, findings, treatment and follow up. Patient advised of return precautions. Patient verbalized understanding and agreed with plan.  Final Clinical Impression(s) / ED Diagnoses Final diagnoses:  Dental infection    Rx / DC Orders ED Discharge Orders         Ordered    clindamycin (CLEOCIN) 150 MG capsule  3 times daily,   Status:  Discontinued        12/03/20 1504    clindamycin (CLEOCIN) 150 MG capsule  3 times daily        12/03/20 1513           Berneice Heinrich 12/03/20 2035    Derwood Kaplan, MD 12/11/20 2249

## 2020-12-03 NOTE — ED Notes (Signed)
ED Provider at bedside. 

## 2020-12-03 NOTE — ED Notes (Signed)
Patient's left face is swollen and she stated that it happens overnight.  Spoke with her husband to make sure that he is coming here to pick her up before I gave her Morphine.  Husband stated that he is coming to pick her up.

## 2020-12-03 NOTE — ED Triage Notes (Signed)
Pt c/o left upper dental issue 2-3 weeks ago-woke this am with left facial swelling-NAD-steady gait

## 2020-12-03 NOTE — ED Provider Notes (Signed)
  Physical Exam  BP (!) 143/90 (BP Location: Left Arm)   Pulse 70   Temp 98.8 F (37.1 C) (Oral)   Resp 18   Ht 5\' 9"  (1.753 m)   Wt 114.8 kg   SpO2 98%   BMI 37.36 kg/m   Physical Exam Vitals and nursing note reviewed.  Constitutional:      General: She is not in acute distress.    Appearance: She is well-developed and well-nourished.  HENT:     Head: Normocephalic and atraumatic.     Comments: Significant swelling of the left side of the face.  Overall poor dentition, tenderness palpation of left upper tooth.  Minimal trismus, likely due to inflammation.  Tenderness palpation along the left jawline, but no pain under the tongue.  Unable to visualize oropharynx due to trismus. Eyes:     Extraocular Movements: EOM normal.  Cardiovascular:     Pulses: Intact distal pulses.  Pulmonary:     Breath sounds: Wheezing present.  Musculoskeletal:     Cervical back: Normal range of motion and neck supple.  Neurological:     Mental Status: She is alert and oriented to person, place, and time.  Psychiatric:        Mood and Affect: Mood and affect normal.     ED Course/Procedures     Procedures  MDM   Pt seen in conjunction with P Badalamente, PA-C.  Please see his notes for full exam, history, and plan.  In brief, patient presenting for evaluation of dental pain and facial swelling.  She injured her tooth several weeks ago.  Last night, she had increased pain and development of significant swelling of the left side of her face.  Today, she is having difficulty opening her mouth all the way due to swelling.  She reports that the pain in the left side of her face and extending into her neck.  No fevers or chills.  No nausea or vomiting.  She has no medical problems, takes medications daily.  On exam, patient does have mild trismus and tenderness palpation of the left upper tooth.  Likely infectious in nature, however considering trismus will obtain CT to ensure no concerning tracking  infection.  Consistent with cellulitis, likely dental source.  No concerning tracking.  Patient given antibiotics and dentistry f/u.        , PA-C 12/03/20 1610    12/05/20, MD 12/11/20 2249

## 2020-12-03 NOTE — Discharge Instructions (Signed)
You came to the emergency department to be evaluated for your left-sided facial swelling and tooth pain.  Your CT scan did not show any infection spreading into your neck.  We started you on antibiotic clindamycin.  I have given you prescription for the same antibiotic, please take 3 capsules 3 times a day for the next 7 days.  Schedule an appointment with a dentist, using the dental resource guide attached or with Dr. Mia Creek.  Return to the ER for difficulty swallowing or breathing, fever, or new or worsening symptoms.  You may have diarrhea from the antibiotics.  It is very important that you continue to take the antibiotics even if you get diarrhea unless a medical professional tells you that you may stop taking them.  If you stop too early the bacteria you are being treated for will become stronger and you may need different, more powerful antibiotics that have more side effects and worsening diarrhea.  Please stay well hydrated and consider probiotics as they may decrease the severity of your diarrhea.  Please be aware that if you take any hormonal contraception (birth control pills, nexplanon, the ring, etc) that your birth control will not work while you are taking antibiotics and you need to use back up protection as directed on the birth control medication information insert.    Please take Ibuprofen (Advil, motrin) and Tylenol (acetaminophen) to relieve your pain.  You may take up to 600 MG (3 pills) of normal strength ibuprofen every 8 hours as needed.  In between doses of ibuprofen you make take tylenol, up to 1,000 mg (two extra strength pills).  Do not take more than 3,000 mg tylenol in a 24 hour period.  Please check all medication labels as many medications such as pain and cold medications may contain tylenol.  Do not drink alcohol while taking these medications.  Do not take other NSAID'S while taking ibuprofen (such as aleve or naproxen).  Please take ibuprofen with food to decrease stomach  upset.   Today you received medications that may make you sleepy or impair your ability to make decisions.  For the next 24 hours please do not drive, operate heavy machinery, care for a small child with out another adult present, or perform any activities that may cause harm to you or someone else if you were to fall asleep or be impaired.

## 2021-04-21 IMAGING — CT CT NECK W/ CM
3 of 4 series · 13 of 33 positions shown, 16 images · IV contrast (Omnipaque)
Comparison: CT neck 06/09/2018

CLINICAL DATA: Lymphadenopathy. Left neck and facial swelling.
Trismus.

EXAM:
CT NECK WITH CONTRAST
TECHNIQUE: Multidetector CT imaging of the neck was performed using the
standard protocol following the bolus administration of intravenous
contrast.
CONTRAST:  75mL OMNIPAQUE IOHEXOL 300 MG/ML  SOLN

[Series 3: axial neck · axial · 0.55mm/px · z∈[-218,-66]mm · 5 of 115 slices shown, 7 images]
[im 20/115  soft-tissue]
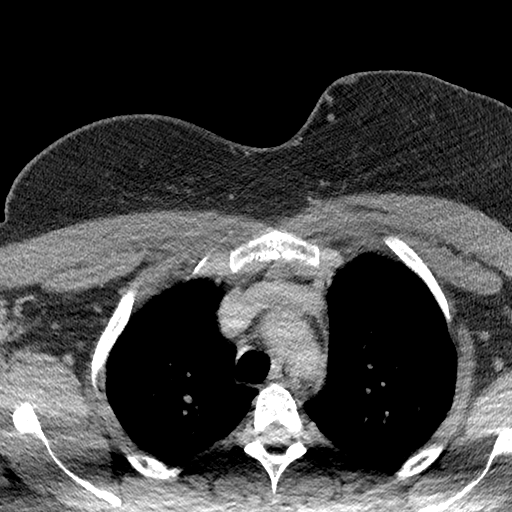
[im 20/115  bone]
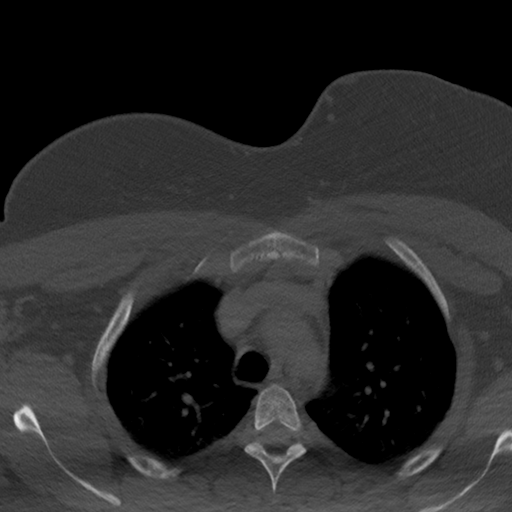
[im 39/115  bone]
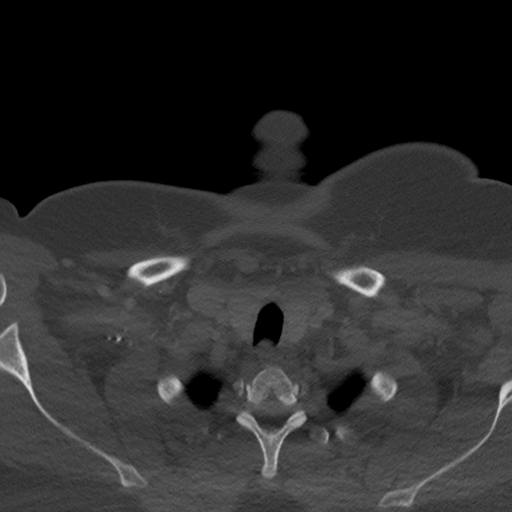
[im 58/115  bone]
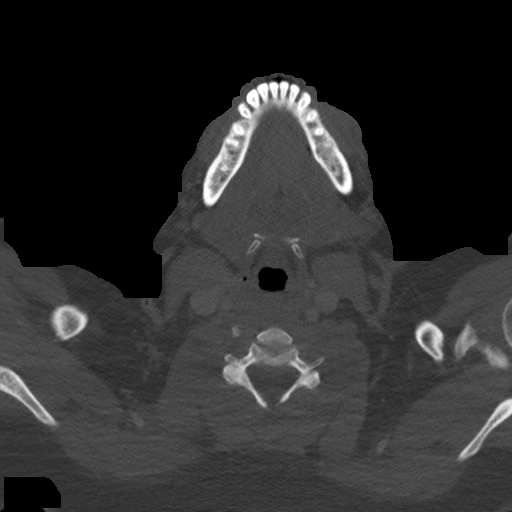
[im 77/115  bone]
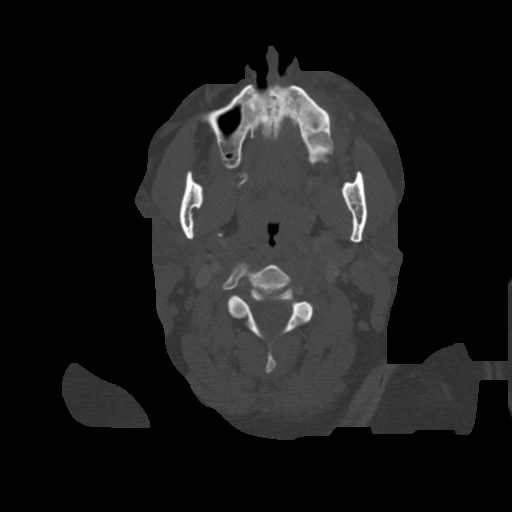
[im 96/115  soft-tissue]
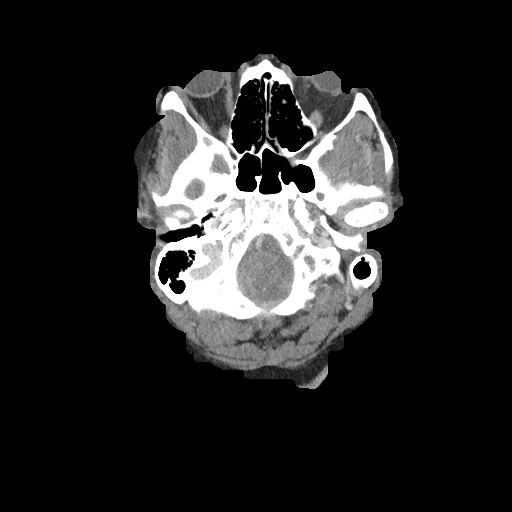
[im 96/115  bone]
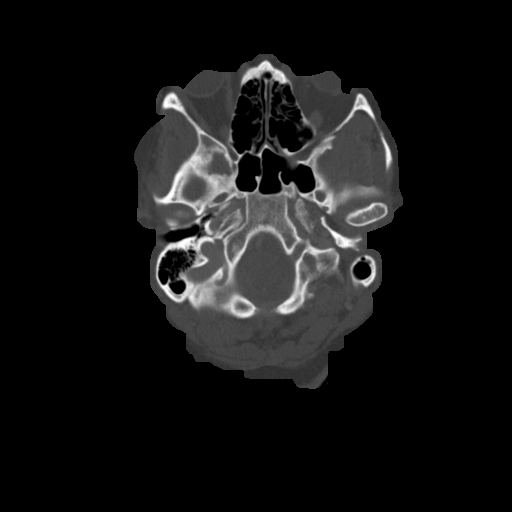

[Series 4: sag neck · sagittal · 0.48mm/px · 5 of 106 slices shown, 6 images]
[im 36/106  bone]
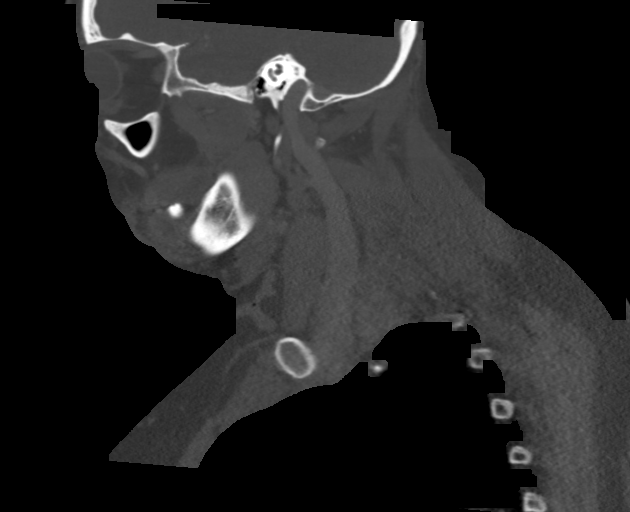
[im 44/106  bone]
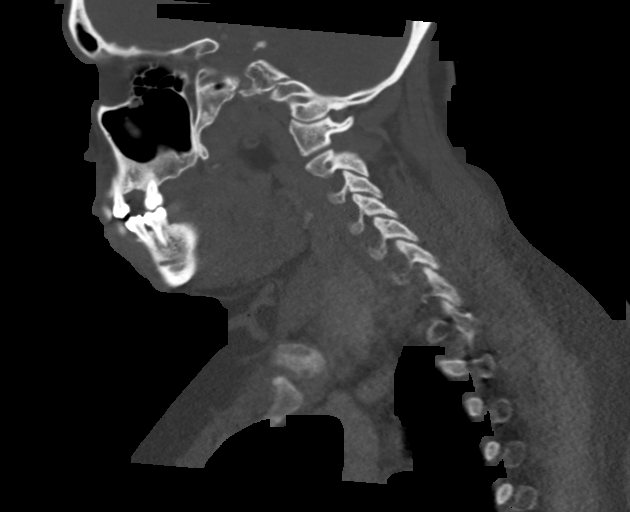
[im 53/106  soft-tissue]
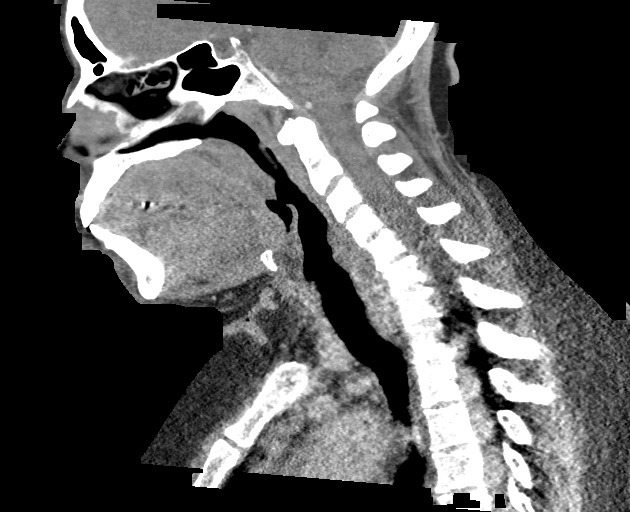
[im 53/106  bone]
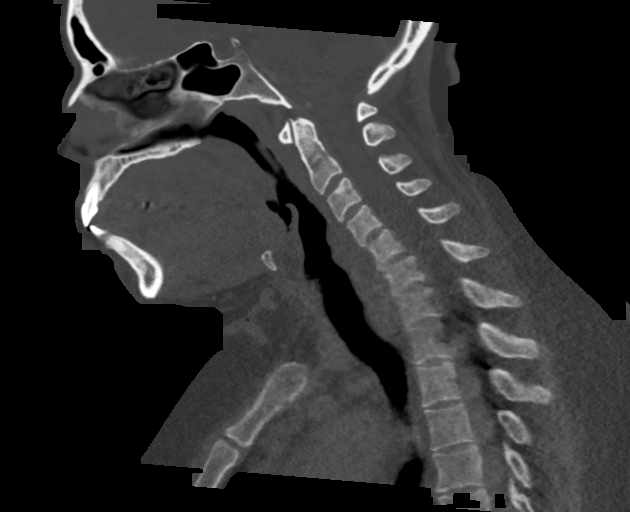
[im 62/106  bone]
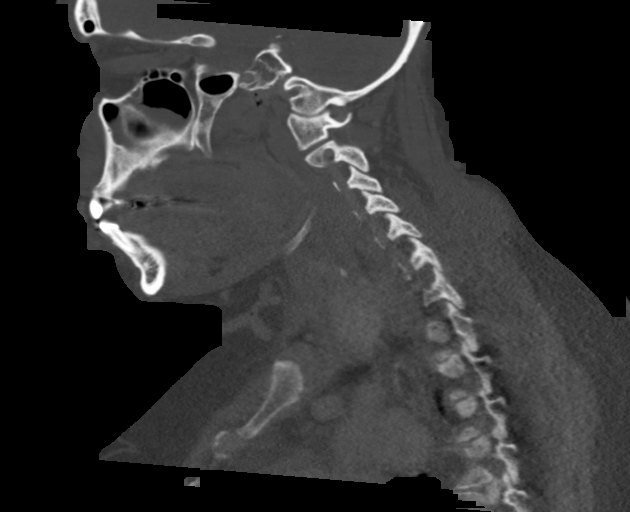
[im 71/106  bone]
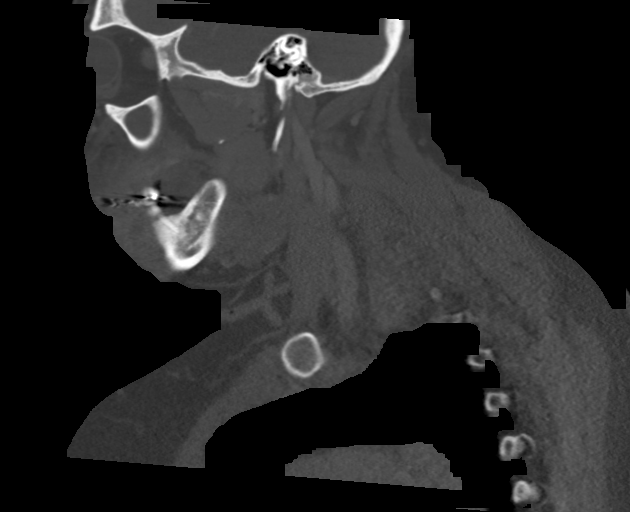

[Series 5: cor neck · coronal · 0.43mm/px · 3 of 130 slices shown]
[im 26/130  bone]
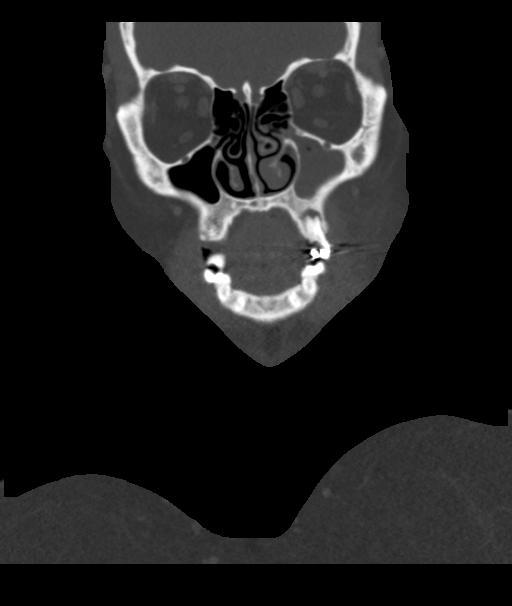
[im 52/130  bone]
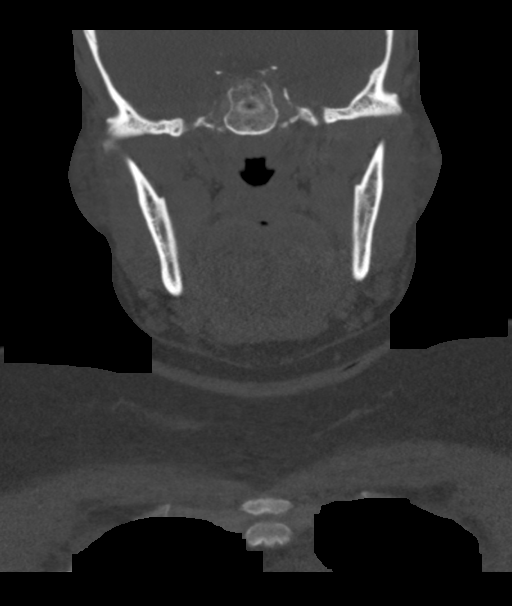
[im 78/130  bone]
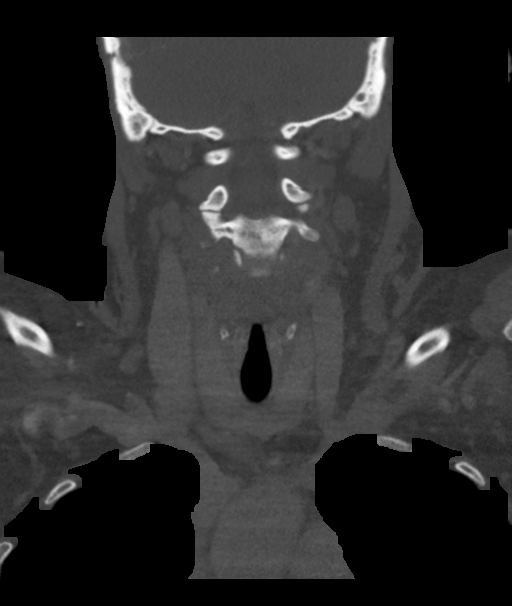

[13 of 33 positions shown; findings below may reference images not displayed]

FINDINGS: Pharynx and larynx: Mild prominence of the palatine tonsils
bilaterally similar to the prior study. No pharyngeal mass or airway
compromise. Normal larynx

Salivary glands: No inflammation, mass, or stone.

Thyroid: Negative

Lymph nodes: No pathologic adenopathy. Right level 2 lymph node 9
mm. Left level 2 lymph node 9 mm. Lymph node size improved from the
prior CT.

Vascular: Normal vascular enhancement.

Limited intracranial: Negative

Visualized orbits: Negative

Mastoids and visualized paranasal sinuses: Mucosal edema throughout
the left maxillary sinus. Remaining sinuses clear

Skeleton: Mild periapical lucency around right lower first molar.
Progressive periapical lucencies around left upper pre molars with
associated caries.

Upper chest: Lung apices clear bilaterally.

Other: Soft tissue swelling overlying the left maxilla compatible
with cellulitis. No subperiosteal abscess. Cellulitis may be due to
dental infection.
IMPRESSION: Soft tissue swelling left face overlying the maxilla consistent with
cellulitis. This may be due to dental infection. Periapical
lucencies around the left upper pre molars compatible with dental
infection. Associated caries. Negative for soft tissue subperiosteal
abscess.

## 2021-08-10 ENCOUNTER — Emergency Department (HOSPITAL_BASED_OUTPATIENT_CLINIC_OR_DEPARTMENT_OTHER)
Admission: EM | Admit: 2021-08-10 | Discharge: 2021-08-10 | Disposition: A | Discharge status: Home / Self Care | Payer: Self-pay | Attending: Emergency Medicine | Admitting: Emergency Medicine

## 2021-08-10 ENCOUNTER — Encounter (HOSPITAL_BASED_OUTPATIENT_CLINIC_OR_DEPARTMENT_OTHER): Payer: Self-pay

## 2021-08-10 ENCOUNTER — Other Ambulatory Visit: Payer: Self-pay

## 2021-08-10 DIAGNOSIS — N764 Abscess of vulva: Secondary | ICD-10-CM | POA: Insufficient documentation

## 2021-08-10 DIAGNOSIS — B9689 Other specified bacterial agents as the cause of diseases classified elsewhere: Secondary | ICD-10-CM | POA: Insufficient documentation

## 2021-08-10 DIAGNOSIS — F17210 Nicotine dependence, cigarettes, uncomplicated: Secondary | ICD-10-CM | POA: Insufficient documentation

## 2021-08-10 DIAGNOSIS — L0291 Cutaneous abscess, unspecified: Secondary | ICD-10-CM

## 2021-08-10 MED ORDER — DOXYCYCLINE HYCLATE 100 MG PO CAPS: 100.0000 mg | ORAL_CAPSULE | Freq: Two times a day (BID) | ORAL | 0 refills | Status: AC

## 2021-08-10 MED ORDER — LIDOCAINE HCL (PF) 1 % IJ SOLN
30.0000 mL | Freq: Once | INTRAMUSCULAR | Status: AC
  Administered 2021-08-10: 30 mL
  Filled 2021-08-10: qty 30

## 2021-08-10 NOTE — ED Provider Notes (Signed)
[facility] EMERGENCY DEPARTMENT Provider Note   CSN: [id number] Arrival date & time: 08/10/21  2010     History Chief Complaint  Patient presents with   Abscess    Beverly [name] is a 40 y.o. female with no pertinent pmh who presents today for a left inguinal abscess. First noted it 3 days ago and it appears to be becoming larger. Says it is uncomfortable to sit, walk or lay down. No fever or chills. Not a diabetic and denies IVDU. Has not noticed bleeding or discharge. 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  No urinary symptoms, vaginal discharge or bleeding or concern for STD. No at-home treatment or previous abscesses.  Past Medical History:  Diagnosis Date   Migraine     There are no problems to display for this patient.   Past Surgical History:  Procedure Laterality Date   HERNIA REPAIR       OB History   No obstetric history on file.     Family History  Problem Relation Age of Onset   Hypertension Other    Diabetes Other    Cancer Other     Social History   Tobacco Use   Smoking status: Every Day    Types: Cigarettes   Smokeless tobacco: Never  Vaping Use   Vaping Use: Never used  Substance Use Topics   Alcohol use: Yes    Comment: occ   Drug use: No    Home Medications Prior to Admission medications   Medication Sig Start Date End Date Taking? Authorizing Provider  Aspirin-Acetaminophen-Caffeine (HEADACHE RELIEF PO) Take 2 tablets by mouth every 4 (four) hours as needed (headache).    [provider]  clindamycin (CLEOCIN) 150 MG capsule TAKE 3 CAPSULES (450 MG TOTAL) BY MOUTH 3 (THREE) TIMES DAILY FOR 7 DAYS. 12/03/20 12/03/21  Haskel Schroeder, PA-C  omeprazole (PRILOSEC) 20 MG capsule Take 1 capsule (20 mg total) by mouth daily. 02/16/16   Arby Barrette, MD  SUMAtriptan (IMITREX) 100 MG tablet Take 100 mg by mouth every 2 (two) hours as needed for migraine or headache. May  repeat in 2 hours if headache persists or recurs.    [provider]    Allergies    Patient has no known allergies.  Review of Systems   Review of Systems  Constitutional:  Negative for chills, fatigue and fever.  Respiratory:  Negative for chest tightness and shortness of breath.   Cardiovascular:  Negative for chest pain and palpitations.  Gastrointestinal:  Negative for abdominal distention, nausea and vomiting.  Genitourinary:  Negative for dysuria, pelvic pain, urgency, vaginal discharge and vaginal pain.  Skin:  Negative for pallor and wound.       Abscess in groin  Neurological:  Negative for dizziness, light-headedness and headaches.  All other systems reviewed and are negative.  Physical Exam Updated Vital Signs BP (!) 148/99 (BP Location: Left Arm)   Pulse 84   Temp 98.7 F (37.1 C) (Oral)   Resp 16   Ht 5' 9.5" (1.765 m)   Wt 112.5 kg   SpO2 98%   BMI 36.10 kg/m   Physical Exam Vitals and nursing note reviewed.  Constitutional:      Appearance: Normal appearance.  HENT:     Head: Normocephalic and atraumatic.  Eyes:     General: No scleral icterus.    Conjunctiva/sclera: Conjunctivae normal.  Cardiovascular:  Rate and Rhythm: Normal rate and regular rhythm.  Pulmonary:     Effort: Pulmonary effort is normal. No respiratory distress.  Abdominal:     General: Abdomen is flat.     Tenderness: There is no abdominal tenderness.  Genitourinary:    General: Normal vulva.  Skin:    General: Skin is warm and dry.     Findings: No rash.     Comments: Patient with 4 x 2 cm fluctuant abscess lateral to the outer left labia.  Located in body crevice.  No bleeding, weeping.  Pain in proportion to the lesion.  No warmth.  No surrounding cellulitis.  Neurological:     Mental Status: She is alert.  Psychiatric:        Mood and Affect: Mood normal.    ED Results / Procedures / Treatments   Labs (all labs ordered are listed, but only abnormal results  are displayed) Labs Reviewed - No data to display  EKG None  Radiology No results found.  Procedures Procedures   Medications Ordered in ED Medications  lidocaine (PF) (XYLOCAINE) 1 % injection 30 mL (30 mLs Other Given by Other 08/10/21 2222)    ED Course  I have reviewed the triage vital signs and the nursing notes.  Pertinent labs & imaging results that were available during my care of the patient were reviewed by me and considered in my medical decision making (see chart for details).    MDM Rules/Calculators/A&P Patient is a 40 year old female who presented with a complaint of abscess x3 days.  She reported that it was increasing in size and pain.  She was fairly tolerating her hands and sitting during her workday so she decided that she needed to come in.  No constitutional symptoms.  Patient denies any concern for vaginal or urinary infections.  I did not feel it necessary to obtain a urine sample or perform a pelvic exam despite the the abscesses proximity to her vulva.  Upon physical exam I noted an abscess lateral to her outer left labia.  It was fluctuant and appropriate for I&D.  I discussed the risks with the patient and she agreed to the procedure.  She was numbed with 2 cc of 1% lidocaine.  Tolerated well.  I then performed the I&D and obtained a large amount of fluid.  Minor bleeding.  I discussed at home care with warm sits baths and explained that her wound may continue to weep for the next few days.  I told her that she may use a maxi pad during her workday.  The abscess was not deep enough for me to feel as though it needed packing.  Return precautions explained and patient agreeable to discharge.  She states "I already feels so much better. I could barely sit before this."  I prescribed the patient doxycycline p.o. to cover for MRSA.  Patient understands potential side effects of doxycycline and agrees to take full course of medication.   Final Clinical Impression(s)  / ED Diagnoses Final diagnoses:  Abscess    Rx / DC Orders Results and diagnoses were explained to the patient. Return precautions discussed in full. Patient had no additional questions and expressed complete understanding.     Woodroe Chen 08/11/21 1315    Alvira Monday, MD 08/11/21 2242

## 2021-08-10 NOTE — ED Triage Notes (Signed)
Pt reports abscess to left inner groin area that she noticed on Saturday. No drainage.

## 2021-08-10 NOTE — Discharge Instructions (Addendum)
Return if you develop fevers, chills or you feel as though the abscess is returning. It will drain for the next few days. Utilize warm baths and sanitary pads as needed.
